# Patient Record
Sex: Female | Born: 1990 | Race: Black or African American | Hispanic: No | Marital: Single | Smoking: Former smoker
Health system: Southern US, Community
[De-identification: ages and names within clinical notes are randomized; demographics above are authoritative.]

## PROBLEM LIST (undated history)

## (undated) ENCOUNTER — Inpatient Hospital Stay (HOSPITAL_COMMUNITY): Payer: Self-pay

## (undated) DIAGNOSIS — A749 Chlamydial infection, unspecified: Secondary | ICD-10-CM

## (undated) DIAGNOSIS — A539 Syphilis, unspecified: Secondary | ICD-10-CM

## (undated) DIAGNOSIS — A523 Neurosyphilis, unspecified: Secondary | ICD-10-CM

## (undated) DIAGNOSIS — Z8619 Personal history of other infectious and parasitic diseases: Secondary | ICD-10-CM

---
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
## 2011-01-05 LAB — POCT URINALYSIS DIP (DEVICE)
Glucose, UA: NEGATIVE mg/dL
Hgb urine dipstick: NEGATIVE
Nitrite: NEGATIVE
Urobilinogen, UA: 0.2 mg/dL (ref 0.0–1.0)
pH: 7 (ref 5.0–8.0)

## 2011-02-10 NOTE — Consult Note (Signed)
[facility]  Patient:    Tracy Waller                        MRN: [id number] Proc. Date: 05/12/00 Attending:  Graylin Shiver. August Saucer, M.D.                          Consultation Report  CHIEF COMPLAINT:  Left wrist pain.  HISTORY OF PRESENT ILLNESS:  The patient is an 20-year-old right-hand-dominant female who fell out of her bunk bed tonight and injured her left wrist.  She had immediate onset of pain and deformity.  She denies any other orthopedic complaints specifically in the left elbow.  PAST MEDICAL AND SURGICAL HISTORY:  Unremarkable.  MEDICATIONS:  Apparently on no medications.  ALLERGIES:  She has no known drug allergies.  PHYSICAL EXAMINATION  EXTREMITIES:  Patient has a deformity in her left wrist.  Pedal pulses are palpable.  EPL, FPL and interosseous are intact.  Patient has diffuse numbness on the dorsal and plantar aspects of her hand.  Elbow range of motion is full. There is no other shoulder or lower extremity complaints.  X-RAY FINDINGS:  Plain x-rays show a displaced distal radius fracture with apex volar angulation and only about 10% contact in the metaphyseal region.  IMPRESSION:  Displaced left distal radius fracture.  DESCRIPTION OF PROCEDURE:  Using conscious sedation and hematoma block, the wrist is reduced.  Cast is placed and is bivalved.  Post-reduction x-rays show anatomic reduction.  PLAN:  Patient will follow up with me in one week for repeat x-rays out of the cast. DD:  05/13/00 TD:  05/14/00 Job: [id number]

## 2011-06-26 LAB — COMPREHENSIVE METABOLIC PANEL
ALT: 12
AST: 21
Albumin: 2.5 — ABNORMAL LOW
Alkaline Phosphatase: 94
BUN: 3 — ABNORMAL LOW
Potassium: 3.1 — ABNORMAL LOW
Sodium: 136
Total Protein: 6.7

## 2011-06-26 LAB — CBC
MCHC: 32.1
MCV: 78.7
Platelets: 267
Platelets: 278
RDW: 14.5

## 2011-06-26 LAB — RPR: RPR Ser Ql: NONREACTIVE

## 2011-06-26 LAB — URIC ACID: Uric Acid, Serum: 5.1

## 2011-06-27 LAB — CBC
HCT: 29.1 — ABNORMAL LOW
Hemoglobin: 9.3 — ABNORMAL LOW
MCHC: 32.7
MCHC: 33.3
MCV: 75.7 — ABNORMAL LOW
MCV: 80.9
Platelets: 263
RBC: 3.14 — ABNORMAL LOW
RDW: 14.6
WBC: 14 — ABNORMAL HIGH

## 2011-06-27 LAB — URINALYSIS, MICROSCOPIC ONLY
Ketones, ur: NEGATIVE
Nitrite: NEGATIVE
Protein, ur: NEGATIVE
Urobilinogen, UA: 0.2
pH: 7.5

## 2012-06-23 ENCOUNTER — Emergency Department (HOSPITAL_BASED_OUTPATIENT_CLINIC_OR_DEPARTMENT_OTHER)
Admission: EM | Admit: 2012-06-23 | Discharge: 2012-06-23 | Disposition: A | Discharge status: Home / Self Care | Payer: Self-pay | Attending: Emergency Medicine | Admitting: Emergency Medicine

## 2012-06-23 ENCOUNTER — Encounter (HOSPITAL_BASED_OUTPATIENT_CLINIC_OR_DEPARTMENT_OTHER): Payer: Self-pay | Admitting: Emergency Medicine

## 2012-06-23 DIAGNOSIS — J039 Acute tonsillitis, unspecified: Secondary | ICD-10-CM | POA: Insufficient documentation

## 2012-06-23 DIAGNOSIS — J45909 Unspecified asthma, uncomplicated: Secondary | ICD-10-CM | POA: Insufficient documentation

## 2012-06-23 MED ORDER — PENICILLIN V POTASSIUM 500 MG PO TABS: 1000.0000 mg | ORAL_TABLET | Freq: Two times a day (BID) | ORAL | Status: DC

## 2012-06-23 MED ORDER — PREDNISONE 20 MG PO TABS: ORAL_TABLET | ORAL | Status: DC

## 2012-06-23 NOTE — ED Notes (Signed)
Pt states she has had a fever, sore throat, difficulty swallowing. Exposed to Strep.

## 2012-06-23 NOTE — ED Notes (Signed)
Discharge instructions reviewed. Prescriptions reviewed. Pt verbalized understanding.

## 2012-06-23 NOTE — ED Provider Notes (Signed)
History     CSN: [id number]  Arrival date & time 06/23/12  9562   First MD Initiated Contact with Patient 06/23/12 1004      Chief Complaint  Patient presents with  . Sore Throat  . Fever    (Consider location/radiation/quality/duration/timing/severity/associated sxs/prior treatment) HPI This 21 year old female has 2 days of a mild to moderately severe sore throat with fever with tender lymph nodes in his neck with no drooling no stridor no voice change no cough no runny nose no chest pain no vomiting no abdominal pain no rash no confusion. He is a recent exposure to positive strep in his family and his sister has the same symptoms now as well. There is no treatment prior to arrival.  History reviewed. No pertinent past medical history.  History reviewed. No pertinent past surgical history.  Family History  Problem Relation Age of Onset  . Asthma Father     History  Substance Use Topics  . Smoking status: Never Smoker   . Smokeless tobacco: Never Used  . Alcohol Use: Yes     occasionally    OB History    Grav Para Term Preterm Abortions TAB SAB Ect Mult Living   2 2 2       2       Review of Systems 10 Systems reviewed and are negative for acute change except as noted in the HPI. Allergies  Review of patient's allergies indicates no known allergies.  Home Medications   Current Outpatient Rx  Name Route Sig Dispense Refill  . PENICILLIN V POTASSIUM 500 MG PO TABS Oral Take 2 tablets (1,000 mg total) by mouth 2 (two) times daily. X 7 days 28 tablet 0  . PREDNISONE 20 MG PO TABS  3 tabs po daily x 2 days 6 tablet 0    Pulse 85  Temp 98.7 F (37.1 C) (Oral)  Resp 18  LMP 04/22/2012  Physical Exam  Nursing note and vitals reviewed. Constitutional:       Awake, alert, nontoxic appearance.  HENT:  Head: Atraumatic.  Mouth/Throat: No oropharyngeal exudate.       Oral mucosa moist, uvula midline, bilateral tonsillitis with erythema and swelling without obvious  peritonsillar abscess and no exudates as well as no airway compromise no trismus no drooling  Eyes: Right eye exhibits no discharge. Left eye exhibits no discharge.  Neck: Neck supple.  Cardiovascular: Normal rate and regular rhythm.   No murmur heard. Pulmonary/Chest: Effort normal and breath sounds normal. No stridor. No respiratory distress. She has no wheezes. She has no rales. She exhibits no tenderness.  Abdominal: Soft. There is no tenderness. There is no rebound.  Musculoskeletal: She exhibits no tenderness.       Baseline ROM, no obvious new focal weakness.  Lymphadenopathy:    She has cervical adenopathy.  Neurological: She is alert.       Mental status and motor strength appears baseline for patient and situation.  Skin: No rash noted.  Psychiatric: She has a normal mood and affect.    ED Course  Procedures (including critical care time)  Labs Reviewed - No data to display No results found.   1. Tonsillitis       MDM  Patient / Family / Caregiver informed of clinical course, understand medical decision-making process, and agree with plan.I doubt any other EMC precluding discharge at this time including, but not necessarily limited to the following:PTA.        Hurman Horn, MD  06/23/12 1024 

## 2012-09-25 NOTE — L&D Delivery Note (Signed)
Delivery Note At 8:18 PM a viable female was delivered via Vaginal, Spontaneous Delivery (Presentation: ; Occiput Anterior).  APGAR: 9-10 , ; weight : 3493 gms..   Placenta status: Intact, Spontaneous.  Cord: 3 vessels with the following complications: None.  Cord pH: none  Anesthesia: Epidural  Episiotomy: none Lacerations: none Suture Repair: none Est. Blood Loss (mL): 350  Mom to postpartum.  Baby to nursery-stable.  HARPER,CHARLES A 06/06/2013, 8:42 PM

## 2012-10-22 ENCOUNTER — Inpatient Hospital Stay (HOSPITAL_COMMUNITY)
Admission: AD | Admit: 2012-10-22 | Discharge: 2012-10-22 | Disposition: A | Discharge status: Home / Self Care | Payer: Medicaid Other | Source: Ambulatory Visit | Attending: Obstetrics & Gynecology | Admitting: Obstetrics & Gynecology

## 2012-10-22 ENCOUNTER — Inpatient Hospital Stay (HOSPITAL_COMMUNITY): Payer: Medicaid Other

## 2012-10-22 ENCOUNTER — Encounter (HOSPITAL_COMMUNITY): Payer: Self-pay | Admitting: *Deleted

## 2012-10-22 DIAGNOSIS — A499 Bacterial infection, unspecified: Secondary | ICD-10-CM | POA: Insufficient documentation

## 2012-10-22 DIAGNOSIS — N760 Acute vaginitis: Secondary | ICD-10-CM | POA: Insufficient documentation

## 2012-10-22 DIAGNOSIS — O4690 Antepartum hemorrhage, unspecified, unspecified trimester: Secondary | ICD-10-CM

## 2012-10-22 DIAGNOSIS — O209 Hemorrhage in early pregnancy, unspecified: Secondary | ICD-10-CM | POA: Insufficient documentation

## 2012-10-22 DIAGNOSIS — R109 Unspecified abdominal pain: Secondary | ICD-10-CM | POA: Insufficient documentation

## 2012-10-22 DIAGNOSIS — O2390 Unspecified genitourinary tract infection in pregnancy, unspecified trimester: Secondary | ICD-10-CM | POA: Insufficient documentation

## 2012-10-22 DIAGNOSIS — B9689 Other specified bacterial agents as the cause of diseases classified elsewhere: Secondary | ICD-10-CM | POA: Insufficient documentation

## 2012-10-22 LAB — URINALYSIS, ROUTINE W REFLEX MICROSCOPIC
Bilirubin Urine: NEGATIVE
Glucose, UA: NEGATIVE mg/dL
Hgb urine dipstick: NEGATIVE
Specific Gravity, Urine: >1.03 — ABNORMAL HIGH (ref 1.005–1.030)
pH: 6 (ref 5.0–8.0)

## 2012-10-22 LAB — CBC WITH DIFFERENTIAL/PLATELET
Basophils Relative: 0 % (ref 0–1)
Eosinophils Absolute: 0.1 10*3/uL (ref 0.0–0.7)
Eosinophils Relative: 1 % (ref 0–5)
Hemoglobin: 10.7 g/dL — ABNORMAL LOW (ref 12.0–15.0)
MCH: 26 pg (ref 26.0–34.0)
MCHC: 32.7 g/dL (ref 30.0–36.0)
MCV: 79.6 fL (ref 78.0–100.0)
Monocytes Relative: 6 % (ref 3–12)
Neutrophils Relative %: 64 % (ref 43–77)
Platelets: 272 10*3/uL (ref 150–400)

## 2012-10-22 LAB — OB RESULTS CONSOLE GC/CHLAMYDIA
Chlamydia: NEGATIVE
Gonorrhea: NEGATIVE

## 2012-10-22 LAB — GC/CHLAMYDIA PROBE AMP
CT Probe RNA: NEGATIVE
GC Probe RNA: NEGATIVE

## 2012-10-22 LAB — WET PREP, GENITAL: Trich, Wet Prep: NONE SEEN

## 2012-10-22 MED ORDER — METRONIDAZOLE 500 MG PO TABS: 500.0000 mg | ORAL_TABLET | Freq: Two times a day (BID) | ORAL | Status: DC

## 2012-10-22 NOTE — MAU Provider Note (Signed)
History     CSN: [id number]  Arrival date and time: 10/22/12 0009   First Provider Initiated Contact with Patient 10/22/12 0053      Chief Complaint  Patient presents with  . Abdominal Cramping   HPI Tracy Waller is a 22 y.o. female at approximately [redacted] weeks gestation who presents to MAU with abdominal pain. The pain started approximately 9 pm while at work. She had to leave work due to the pain.  She describes the pain as sharp that comes and goes.  The onset was sudden. She rates the pain as 10/10 initially but now 6/10.  Associated symptoms include vaginal bleeding that she describes as less than a period. The history was provided by the patient.  OB History    Grav Para Term Preterm Abortions TAB SAB Ect Mult Living   2 2 2       2       Past Medical History  Diagnosis Date  . Hypertension   . No pertinent past medical history     Past Surgical History  Procedure Date  . Cesarean section   . No past surgeries     Family History  Problem Relation Age of Onset  . Asthma Father     History  Substance Use Topics  . Smoking status: Never Smoker   . Smokeless tobacco: Never Used  . Alcohol Use: No     Comment: occasionally    Allergies: No Known Allergies  Prescriptions prior to admission  Medication Sig Dispense Refill  . penicillin v potassium (VEETID) 500 MG tablet Take 2 tablets (1,000 mg total) by mouth 2 (two) times daily. X 7 days  28 tablet  0  . predniSONE (DELTASONE) 20 MG tablet 3 tabs po daily x 2 days  6 tablet  0    Review of Systems  Constitutional: Negative for fever, chills and weight loss.  HENT: Negative for ear pain, nosebleeds, congestion, sore throat and neck pain.   Eyes: Negative for blurred vision, double vision, photophobia and pain.  Respiratory: Negative for cough, shortness of breath and wheezing.   Cardiovascular: Negative for chest pain, palpitations and leg swelling.  Gastrointestinal: Positive for abdominal pain. Negative  for heartburn, nausea, vomiting, diarrhea and constipation.  Genitourinary: Negative for dysuria, urgency and frequency.       Vaginal bleeding  Musculoskeletal: Positive for back pain. Negative for myalgias.  Skin: Negative for itching and rash.  Neurological: Negative for dizziness, sensory change, speech change, seizures, weakness and headaches.  Endo/Heme/Allergies: Does not bruise/bleed easily.  Psychiatric/Behavioral: Negative for depression and substance abuse. The patient is not nervous/anxious and does not have insomnia.    Blood pressure 128/74, pulse 94, temperature 97.9 F (36.6 C), temperature source Oral, resp. rate 20, height 5\' 3"  (1.6 m), weight 171 lb 4 oz (77.678 kg), last menstrual period 08/19/2012, SpO2 100.00%.  Physical Exam  Nursing note and vitals reviewed. Constitutional: She is oriented to person, place, and time. She appears well-developed and well-nourished. No distress.  HENT:  Head: Normocephalic and atraumatic.  Eyes: EOM are normal.  Neck: Neck supple.  Cardiovascular: Normal rate.   Respiratory: Effort normal.  GI: Soft. There is tenderness. There is no rebound and no guarding.       Minimal tenderness lower abdomen with palpation.  Genitourinary:       External genitalia without lesions, no blood noted. Cervix inflamed, long, closed, no CMT, no adnexal tenderness. Unable to palpate uterus due to patient habitus.  Musculoskeletal: Normal range of motion.  Neurological: She is alert and oriented to person, place, and time.  Skin: Skin is warm and dry.  Psychiatric: She has a normal mood and affect. Her behavior is normal. Judgment and thought content normal.   Results for orders placed during the hospital encounter of 10/22/12 (from the past 24 hour(s))  URINALYSIS, ROUTINE W REFLEX MICROSCOPIC     Status: Abnormal   Collection Time   10/22/12 12:15 AM      Component Value Range   Color, Urine YELLOW  YELLOW   APPearance CLEAR  CLEAR   Specific  Gravity, Urine >1.030 (*) 1.005 - 1.030   pH 6.0  5.0 - 8.0   Glucose, UA NEGATIVE  NEGATIVE mg/dL   Hgb urine dipstick NEGATIVE  NEGATIVE   Bilirubin Urine NEGATIVE  NEGATIVE   Ketones, ur NEGATIVE  NEGATIVE mg/dL   Protein, ur NEGATIVE  NEGATIVE mg/dL   Urobilinogen, UA 1.0  0.0 - 1.0 mg/dL   Nitrite NEGATIVE  NEGATIVE   Leukocytes, UA NEGATIVE  NEGATIVE  POCT PREGNANCY, URINE     Status: Abnormal   Collection Time   10/22/12 12:32 AM      Component Value Range   Preg Test, Ur POSITIVE (*) NEGATIVE  WET PREP, GENITAL     Status: Abnormal   Collection Time   10/22/12  1:20 AM      Component Value Range   Yeast Wet Prep HPF POC NONE SEEN  NONE SEEN   Trich, Wet Prep NONE SEEN  NONE SEEN   Clue Cells Wet Prep HPF POC MODERATE (*) NONE SEEN   WBC, Wet Prep HPF POC FEW (*) NONE SEEN  CBC WITH DIFFERENTIAL     Status: Abnormal   Collection Time   10/22/12  1:20 AM      Component Value Range   WBC 11.8 (*) 4.0 - 10.5 K/uL   RBC 4.11  3.87 - 5.11 MIL/uL   Hemoglobin 10.7 (*) 12.0 - 15.0 g/dL   HCT 62.1 (*) 30.8 - 65.7 %   MCV 79.6  78.0 - 100.0 fL   MCH 26.0  26.0 - 34.0 pg   MCHC 32.7  30.0 - 36.0 g/dL   RDW 84.6  96.2 - 95.2 %   Platelets 272  150 - 400 K/uL   Neutrophils Relative 64  43 - 77 %   Neutro Abs 7.5  1.7 - 7.7 K/uL   Lymphocytes Relative 29  12 - 46 %   Lymphs Abs 3.4  0.7 - 4.0 K/uL   Monocytes Relative 6  3 - 12 %   Monocytes Absolute 0.7  0.1 - 1.0 K/uL   Eosinophils Relative 1  0 - 5 %   Eosinophils Absolute 0.1  0.0 - 0.7 K/uL   Basophils Relative 0  0 - 1 %   Basophils Absolute 0.0  0.0 - 0.1 K/uL    Procedures  US Ob Comp Less 14 Wks  10/22/2012  *RADIOLOGY REPORT*  Clinical Data: Pregnant, bleeding, cramping.  OBSTETRIC <14 WK ULTRASOUND  Technique:  Transabdominal ultrasound was performed for evaluation of the gestation as well as the maternal uterus and adnexal regions.  Comparison:  None.  Intrauterine gestational sac: Visualized/normal in shape. Yolk  sac: Identified Embryo: Identified Cardiac Activity: Identified Heart Rate: 170 bpm  CRL:  22 mm  8 w  6 d         [place] EDC: 05/28/2013  Maternal uterus/Adnexae: No subchorionic hemorrhage.  Normal sonographic appearance to the ovaries.  Corpus luteal cyst on the left.  No free fluid.  IMPRESSION: Single intrauterine gestation with cardiac activity documented. Estimated age of 8 weeks 6 days by crown-rump length.   Original Report Authenticated By: Jearld Lesch, M.D.     Assessment: 22 y.o. female @ 8.[redacted] weeks gestation with vaginal bleeding   Bacterial vaginosis   Left CLC  Plan:  Start prenatal care   Treat BV   Threatened AB precautions, return as needed  I have reviewed this patient's vital signs, nurses notes, appropriate labs and imaging.  I have discussed findings with the patient and she voices understanding.    Medication List     As of 10/26/2012  4:58 PM    START taking these medications         metroNIDAZOLE 500 MG tablet   Commonly known as: FLAGYL   Take 1 tablet (500 mg total) by mouth 2 (two) times daily.      STOP taking these medications         penicillin v potassium 500 MG tablet   Commonly known as: VEETID      predniSONE 20 MG tablet   Commonly known as: DELTASONE          Where to get your medications    These are the prescriptions that you need to pick up.   You may get these medications from any pharmacy.         metroNIDAZOLE 500 MG tablet            Alecxis Baltzell, RN, FNP, BC 10/22/2012, 2:16 AM

## 2012-10-22 NOTE — MAU Note (Signed)
Pt states 1.5 hours ago she starte bleeding and cramping-is not wearing a pad

## 2012-10-29 NOTE — MAU Provider Note (Signed)
Attestation of Attending Supervision of Advanced Practitioner (CNM/NP): Evaluation and management procedures were performed by the Advanced Practitioner under my supervision and collaboration. I have reviewed the Advanced Practitioner's note and chart, and I agree with the management and plan.  Izabelle Daus H. 9:14 PM   

## 2012-12-11 ENCOUNTER — Encounter (HOSPITAL_COMMUNITY): Payer: Self-pay

## 2012-12-11 ENCOUNTER — Inpatient Hospital Stay (HOSPITAL_COMMUNITY)
Admission: AD | Admit: 2012-12-11 | Discharge: 2012-12-11 | Disposition: A | Discharge status: Home / Self Care | Payer: Medicaid Other | Source: Ambulatory Visit | Attending: Family Medicine | Admitting: Family Medicine

## 2012-12-11 DIAGNOSIS — K089 Disorder of teeth and supporting structures, unspecified: Secondary | ICD-10-CM

## 2012-12-11 DIAGNOSIS — O0930 Supervision of pregnancy with insufficient antenatal care, unspecified trimester: Secondary | ICD-10-CM | POA: Insufficient documentation

## 2012-12-11 DIAGNOSIS — O99891 Other specified diseases and conditions complicating pregnancy: Secondary | ICD-10-CM | POA: Insufficient documentation

## 2012-12-11 DIAGNOSIS — Z3482 Encounter for supervision of other normal pregnancy, second trimester: Secondary | ICD-10-CM

## 2012-12-11 DIAGNOSIS — K0889 Other specified disorders of teeth and supporting structures: Secondary | ICD-10-CM

## 2012-12-11 MED ORDER — ACETAMINOPHEN 325 MG PO TABS
650.0000 mg | ORAL_TABLET | Freq: Once | ORAL | Status: AC
  Administered 2012-12-11: 650 mg via ORAL
  Filled 2012-12-11: qty 2

## 2012-12-11 NOTE — MAU Provider Note (Signed)
  History     CSN: [id number]  Arrival date and time: 12/11/12 0808   First Provider Initiated Contact with Patient 12/11/12 [phone]      Chief Complaint  Patient presents with  . Dental Pain   HPI Tracy Waller 22 y.o. [redacted]w[redacted]d   No prenatal care yet.  Comes to MAU with tooth pain.  Had swelling yesterday in right lower jaw.  Reports bleeding gums also.    OB History   Grav Para Term Preterm Abortions TAB SAB Ect Mult Living   3 2 2       2       Past Medical History  Diagnosis Date  . Hypertension   . No pertinent past medical history     Past Surgical History  Procedure Laterality Date  . Cesarean section    . No past surgeries      Family History  Problem Relation Age of Onset  . Asthma Father     History  Substance Use Topics  . Smoking status: Never Smoker   . Smokeless tobacco: Never Used  . Alcohol Use: No     Comment: occasionally    Allergies: No Known Allergies  Prescriptions prior to admission  Medication Sig Dispense Refill  . Aspirin-Salicylamide-Caffeine (BC HEADACHE POWDER PO) Take 1 packet by mouth every 6 (six) hours as needed. For headache        Review of Systems  Constitutional: Negative for fever.  HENT:       Tooth pain  Gastrointestinal: Negative for nausea, vomiting and abdominal pain.  Genitourinary:       No vaginal discharge. No vaginal bleeding. No dysuria.   Physical Exam   Blood pressure 125/87, pulse 101, temperature 98.4 F (36.9 C), temperature source Oral, resp. rate 18, height 5\' 2"  (1.575 m), weight 170 lb 6.4 oz (77.293 kg), last menstrual period 08/19/2012.  Physical Exam  Nursing note and vitals reviewed. Constitutional: She is oriented to person, place, and time. She appears well-developed and well-nourished.  HENT:  Head: Normocephalic.  No swelling of face or gums.  Tender area on right lower jaw to palpation.  Teeth intact.  Fillings noted in some teeth and are intact.  Eyes: EOM are normal.  Neck: Neck  supple.  GI:  FHT heard with doppler  Musculoskeletal: Normal range of motion.  Neurological: She is alert and oriented to person, place, and time.  Skin: Skin is warm and dry.  Psychiatric: She has a normal mood and affect.    MAU Course  Procedures  MDM   Assessment and Plan  Tooth pain in pregnancy  Plan Message sent to clinic downstairs to schedule an appointment to begin prenatal care. Number given for Tracy Waller to call and register for Medicaid downstairs. Sheet of dentists and [facility] dental clinic given.  Tracy Waller instructed she will need to be prepared to pay out of pocket for dentist. Drink at least 8 8-oz glasses of water every day. Take Tylenol 325 mg 2 tablets by mouth every 4 hours if needed for pain.   Wisdom Rickey 12/11/2012, 9:27 AM

## 2012-12-11 NOTE — MAU Note (Signed)
Toothache x 2 days 

## 2012-12-11 NOTE — MAU Note (Signed)
Patient states she has had a tooth ache for two days. Has had no prenatal care. Is having no problems with the pregnancy, no pain or bleeding. Would like to listen to fetal heart tones.

## 2012-12-11 NOTE — MAU Provider Note (Signed)
Chart reviewed and agree with management and plan.  

## 2012-12-13 ENCOUNTER — Inpatient Hospital Stay (HOSPITAL_COMMUNITY)
Admission: AD | Admit: 2012-12-13 | Discharge: 2012-12-13 | Disposition: A | Discharge status: Home / Self Care | Payer: Self-pay | Source: Ambulatory Visit | Attending: Obstetrics & Gynecology | Admitting: Obstetrics & Gynecology

## 2012-12-13 NOTE — MAU Note (Signed)
Patient had an appointment with Nita the financial representative today at 1445. When she arrived at the front desk there was no one there to direct her to this office. Patient was sent to MAU and she registered for her appointment. Patient is not to be seen in MAU.

## 2012-12-19 ENCOUNTER — Inpatient Hospital Stay (HOSPITAL_COMMUNITY)
Admission: AD | Admit: 2012-12-19 | Discharge: 2012-12-19 | Disposition: A | Discharge status: Home / Self Care | Payer: Medicaid Other | Source: Ambulatory Visit | Attending: Obstetrics and Gynecology | Admitting: Obstetrics and Gynecology

## 2012-12-19 DIAGNOSIS — O99891 Other specified diseases and conditions complicating pregnancy: Secondary | ICD-10-CM | POA: Insufficient documentation

## 2012-12-19 DIAGNOSIS — K089 Disorder of teeth and supporting structures, unspecified: Secondary | ICD-10-CM | POA: Insufficient documentation

## 2012-12-19 DIAGNOSIS — K0889 Other specified disorders of teeth and supporting structures: Secondary | ICD-10-CM

## 2012-12-19 DIAGNOSIS — M549 Dorsalgia, unspecified: Secondary | ICD-10-CM

## 2012-12-19 DIAGNOSIS — O209 Hemorrhage in early pregnancy, unspecified: Secondary | ICD-10-CM | POA: Insufficient documentation

## 2012-12-19 LAB — URINALYSIS, ROUTINE W REFLEX MICROSCOPIC
Bilirubin Urine: NEGATIVE
Glucose, UA: NEGATIVE mg/dL
Hgb urine dipstick: NEGATIVE
Protein, ur: NEGATIVE mg/dL
Urobilinogen, UA: 0.2 mg/dL (ref 0.0–1.0)

## 2012-12-19 MED ORDER — AMOXICILLIN 500 MG PO CAPS
1000.0000 mg | ORAL_CAPSULE | Freq: Once | ORAL | Status: AC
  Administered 2012-12-19: 1000 mg via ORAL
  Filled 2012-12-19: qty 2

## 2012-12-19 MED ORDER — AMOXICILLIN 500 MG PO CAPS: 500.0000 mg | ORAL_CAPSULE | Freq: Three times a day (TID) | ORAL | Status: AC

## 2012-12-19 NOTE — MAU Note (Addendum)
Patient is in with in c/o right lateral side pain and vaginal bleeding this morning. Patient is not bleeding now, she is without a pad. She states that her first OB appt with [facility] is April 26th. Patient states that she have a slight burning with urination.

## 2012-12-19 NOTE — MAU Provider Note (Signed)
History     CSN: [id number]  Arrival date and time: 12/19/12 1318   None     Chief Complaint  Patient presents with  . Vaginal Bleeding   Vaginal Bleeding  G3P2 [redacted]w[redacted]d presents for abdominal pain, vaginal bleeding X 1 day, and tooth ache.  Abdominal pain Began last night while lying on couch. Onset was sudden. Located over R flank. Worse when getting up and walking. Better when lying still. No inciting even. No radiation. No nausea/vomiting. No fever/chills. No recent illnesses. No heavy lifting but pt has a 21 y/o and 21 y/o at home with her. Some painful urination, no frequency.   Vaginal bleeding Reports seeing a blood clot in her underwear this morning. While she was in the shower she saw another clot drip into the shower. Reports some abdominal pain, denies other symptoms (see above).  Tooth pain She reports tooth pain on and off for the past month. She says that she gets tooth and gum pain during pregnancy but this is different. Right now it hurt on her R lower jaw. She has not been to the dentist in a long time. She was evaluated here previously this month for the same problem and was told to take Tylenol, which isn't helping.   Past Medical History  Diagnosis Date  . Hypertension   . No pertinent past medical history     Past Surgical History  Procedure Laterality Date  . Cesarean section    . No past surgeries      Family History  Problem Relation Age of Onset  . Asthma Father     History  Substance Use Topics  . Smoking status: Never Smoker   . Smokeless tobacco: Never Used  . Alcohol Use: No     Comment: occasionally    Allergies: No Known Allergies  No prescriptions prior to admission    Review of Systems  Genitourinary: Positive for vaginal bleeding.   Pertinent positive and negatives discussed in HPI.  Physical Exam   Blood pressure 127/67, pulse 94, temperature 98.7 F (37.1 C), temperature source Oral, resp. rate 16, height 5\' 3"  (1.6 m),  weight 77.225 kg (170 lb 4 oz), last menstrual period 08/19/2012, SpO2 100.00%.  Physical Exam  Constitutional: She appears well-developed and well-nourished. No distress.  HENT:  Mouth/Throat:    Dentition in moderate condition. Multiple fillings throughout mouth. Pt reports tenderness to tooth #30 with tapping with a tongue depressor.  Respiratory: No respiratory distress.  GI: Soft. She exhibits no distension. There is tenderness (located over R flank/back). There is no rebound and no guarding.  Pelvic:  External genitalia wnl            Vagina- moderate amount white discharge without odor.  Neg for bleeding or blood seen           Cervix was closed and nontender           Uterus-17 week size nontender Skin warm and dry Psy-normal mood, affect, thought process   Results for orders placed during the hospital encounter of 12/19/12 (from the past 24 hour(s))  URINALYSIS, ROUTINE W REFLEX MICROSCOPIC     Status: None   Collection Time    12/19/12  1:35 PM      Result Value Range   Color, Urine YELLOW  YELLOW   APPearance CLEAR  CLEAR   Specific Gravity, Urine 1.020  1.005 - 1.030   pH 6.5  5.0 - 8.0   Glucose, UA NEGATIVE  NEGATIVE mg/dL   Hgb urine dipstick NEGATIVE  NEGATIVE   Bilirubin Urine NEGATIVE  NEGATIVE   Ketones, ur NEGATIVE  NEGATIVE mg/dL   Protein, ur NEGATIVE  NEGATIVE mg/dL   Urobilinogen, UA 0.2  0.0 - 1.0 mg/dL   Nitrite NEGATIVE  NEGATIVE   Leukocytes, UA NEGATIVE  NEGATIVE    MAU Course  Procedures  MDM Due to tenderness and association with movement I believe that the pain is musculoskeletal in nature.  Fetal heart tones present at 150 bpm, no need for ultrasound today. Cervix closed and no blood from cervix.  No obvious abscesses to mouth. Treating for presumptive tooth infection as pt is pregnant and does not currently have access to a dentist.   Assessment and Plan  Back pain, musculoskeletal  - Rest, ice, heat, tylenol - try to practice better  body mechanics when lifting  Vaginal bleeding - No obvious lesions present on pelvic exam, no bleeding, cervix closed - Fetal heart tones present - Ok to DC home  Tooth pain - Rx. Amoxicillin 1 g po x 1, 500 g PO TID x 3 days--1gm amoxicillin given in MAU - See dentist ASAP when medicaid comes in  [place], Ephriam Knuckles 12/19/2012, 2:48 PM   I have reviewed the HPI with the student, observed his physical exam, reviewed lab and plan of care and agree with his findings

## 2012-12-20 NOTE — MAU Provider Note (Signed)
Attestation of Attending Supervision of Advanced Practitioner (CNM/NP): Evaluation and management procedures were performed by the Advanced Practitioner under my supervision and collaboration.  I have reviewed the Advanced Practitioner's note and chart, and I agree with the management and plan.  Zoriah Pulice 12/20/2012 8:10 AM   

## 2013-01-03 ENCOUNTER — Encounter (HOSPITAL_COMMUNITY): Payer: Self-pay

## 2013-01-03 ENCOUNTER — Inpatient Hospital Stay (HOSPITAL_COMMUNITY)
Admission: AD | Admit: 2013-01-03 | Discharge: 2013-01-03 | Disposition: A | Discharge status: Home / Self Care | Payer: Medicaid Other | Source: Ambulatory Visit | Attending: Obstetrics & Gynecology | Admitting: Obstetrics & Gynecology

## 2013-01-03 DIAGNOSIS — K047 Periapical abscess without sinus: Secondary | ICD-10-CM

## 2013-01-03 DIAGNOSIS — O99891 Other specified diseases and conditions complicating pregnancy: Secondary | ICD-10-CM | POA: Insufficient documentation

## 2013-01-03 DIAGNOSIS — K089 Disorder of teeth and supporting structures, unspecified: Secondary | ICD-10-CM | POA: Insufficient documentation

## 2013-01-03 DIAGNOSIS — K044 Acute apical periodontitis of pulpal origin: Secondary | ICD-10-CM

## 2013-01-03 MED ORDER — IBUPROFEN 600 MG PO TABS: 600.0000 mg | ORAL_TABLET | Freq: Four times a day (QID) | ORAL | Status: DC | PRN

## 2013-01-03 NOTE — MAU Provider Note (Signed)
  History     CSN: [id number]  Arrival date and time: 01/03/13 7829   First Provider Initiated Contact with Patient 01/03/13 [phone]      Chief Complaint  Patient presents with  . Dental Pain   HPI  Tracy Waller is a 22 y.o. who presents with 2 weeks of R jaw swelling. She is also having associated headaches. She was here for a similar issue 2-3 weeks ago, was prescribed amoxicillin which did not help. She states it is different now because there was just pain before but now it is swollen. Denies any bleeding. Took BC powder for pain has rinsed mouth with salt water. Has not seen a dentist due medicaid coverage not being activated yet.   OB History   Grav Para Term Preterm Abortions TAB SAB Ect Mult Living   3 2 2       2       Past Medical History  Diagnosis Date  . Hypertension   . No pertinent past medical history     Past Surgical History  Procedure Laterality Date  . Cesarean section      Family History  Problem Relation Age of Onset  . Asthma Father     History  Substance Use Topics  . Smoking status: Never Smoker   . Smokeless tobacco: Never Used  . Alcohol Use: No     Comment: occasionally    Allergies: No Known Allergies  Prescriptions prior to admission  Medication Sig Dispense Refill  . Aspirin-Caffeine (BC FAST PAIN RELIEF PO) Take by mouth.        Review of Systems  Constitutional: Negative for fever and chills.  Eyes: Negative.   Respiratory: Negative.   Cardiovascular: Negative.   Gastrointestinal: Negative.   Genitourinary: Negative.   Musculoskeletal: Negative.   Neurological: Positive for headaches.  Endo/Heme/Allergies: Negative.    Physical Exam   Blood pressure 114/66, pulse 86, temperature 98.5 F (36.9 C), temperature source Oral, resp. rate 16, height 5\' 3"  (1.6 m), weight 77.565 kg (171 lb), last menstrual period 08/19/2012, SpO2 100.00%.  FHT: 147 bpm   Physical Exam  HENT:  Head:    Small, mobile 2cm mass along R  mandible which is tender to palpation.No erythema,bleeding along gums, or drainage. Dentition intact.    Non tender Mandibular lymphadenopathy noted  MAU Course  Procedures  MDM Fetal heart tones: 147  Assessment and Plan  1. Dental pain, referred to go see a dentist. Last time she was seen did not try to go to a dentist.  2. Aspirin use educated to avoid aspirin use when possible. Possible option given ibuprofen (instructed not to use in 3rd trimester).  Rosalee Kaufman 01/03/2013, 6:09 AM   I was present for the exam and agree with above. No emergency condition evident. Pt afebrile and well-appearing. Pt STRONGLY encourage to go to dentist for this problem and to inquire about sliding scale/payment programs. MAU is not appropriate place to seek dental care, especially w/ failure of Amox. If fever occurs and or Sx worsen prior to dental appt, go to [facility] ED. Dr. Debroah Loop aware, agrees w/ POC.  Dental letter given.  [place], CNM 01/03/2013 6:59 AM

## 2013-01-03 NOTE — MAU Note (Signed)
Lump on gum on right lower side of mouth, right jaw swollen, tooth pain. Swollen x2 weeks. Tooth pain x 3 weeks. Denies abdominal pain/vaginal bleeding/vaginal discharge.

## 2013-01-04 ENCOUNTER — Emergency Department (HOSPITAL_BASED_OUTPATIENT_CLINIC_OR_DEPARTMENT_OTHER)
Admission: EM | Admit: 2013-01-04 | Discharge: 2013-01-04 | Disposition: A | Discharge status: Home / Self Care | Payer: Medicaid Other | Attending: Emergency Medicine | Admitting: Emergency Medicine

## 2013-01-04 ENCOUNTER — Encounter (HOSPITAL_BASED_OUTPATIENT_CLINIC_OR_DEPARTMENT_OTHER): Payer: Self-pay | Admitting: *Deleted

## 2013-01-04 DIAGNOSIS — R0602 Shortness of breath: Secondary | ICD-10-CM | POA: Insufficient documentation

## 2013-01-04 DIAGNOSIS — Z8679 Personal history of other diseases of the circulatory system: Secondary | ICD-10-CM | POA: Insufficient documentation

## 2013-01-04 DIAGNOSIS — O9989 Other specified diseases and conditions complicating pregnancy, childbirth and the puerperium: Secondary | ICD-10-CM | POA: Insufficient documentation

## 2013-01-04 DIAGNOSIS — K047 Periapical abscess without sinus: Secondary | ICD-10-CM | POA: Insufficient documentation

## 2013-01-04 DIAGNOSIS — R059 Cough, unspecified: Secondary | ICD-10-CM | POA: Insufficient documentation

## 2013-01-04 DIAGNOSIS — R221 Localized swelling, mass and lump, neck: Secondary | ICD-10-CM | POA: Insufficient documentation

## 2013-01-04 DIAGNOSIS — R220 Localized swelling, mass and lump, head: Secondary | ICD-10-CM | POA: Insufficient documentation

## 2013-01-04 DIAGNOSIS — R05 Cough: Secondary | ICD-10-CM | POA: Insufficient documentation

## 2013-01-04 MED ORDER — HYDROCODONE-ACETAMINOPHEN 5-325 MG PO TABS: 2.0000 | ORAL_TABLET | ORAL | Status: DC | PRN

## 2013-01-04 MED ORDER — CLINDAMYCIN HCL 150 MG PO CAPS
300.0000 mg | ORAL_CAPSULE | Freq: Once | ORAL | Status: AC
  Administered 2013-01-04: 300 mg via ORAL
  Filled 2013-01-04: qty 2

## 2013-01-04 MED ORDER — CLINDAMYCIN HCL 300 MG PO CAPS: 300.0000 mg | ORAL_CAPSULE | Freq: Four times a day (QID) | ORAL | Status: DC

## 2013-01-04 MED ORDER — HYDROCODONE-ACETAMINOPHEN 5-325 MG PO TABS
2.0000 | ORAL_TABLET | Freq: Once | ORAL | Status: AC
  Administered 2013-01-04: 2 via ORAL
  Filled 2013-01-04: qty 2

## 2013-01-04 NOTE — ED Notes (Signed)
Pt states the lower right side of her face has been swollen x 2 weeks. Pt is [redacted] weeks pregnant and has been to [facility] for this particular issue as well several times over the past 2 weeks. Given Tylenol and antibiotics, but no better.

## 2013-01-04 NOTE — ED Provider Notes (Signed)
History     CSN: [id number]  Arrival date & time 01/04/13  3086   First MD Initiated Contact with Patient 01/04/13 1937      Chief Complaint  Patient presents with  . Facial Swelling    (Consider location/radiation/quality/duration/timing/severity/associated sxs/prior treatment) Patient is a 22 y.o. female presenting with tooth pain. The history is provided by the patient. No language interpreter was used.  Dental PainThe primary symptoms include mouth pain, shortness of breath and cough. Episode onset: 2 weeks. The symptoms are worsening. The symptoms are new. The symptoms occur constantly.  Additional symptoms include: gum swelling and gum tenderness.  Pt complains of swelling to right side of mouth  Past Medical History  Diagnosis Date  . Hypertension   . No pertinent past medical history     Past Surgical History  Procedure Laterality Date  . Cesarean section      Family History  Problem Relation Age of Onset  . Asthma Father     History  Substance Use Topics  . Smoking status: Never Smoker   . Smokeless tobacco: Never Used  . Alcohol Use: No     Comment: occasionally    OB History   Grav Para Term Preterm Abortions TAB SAB Ect Mult Living   3 2 2       2       Review of Systems  Respiratory: Positive for cough and shortness of breath.   All other systems reviewed and are negative.    Allergies  Review of patient's allergies indicates no known allergies.  Home Medications   Current Outpatient Rx  Name  Route  Sig  Dispense  Refill  . clindamycin (CLEOCIN) 300 MG capsule   Oral   Take 1 capsule (300 mg total) by mouth every 6 (six) hours.   28 capsule   0   . HYDROcodone-acetaminophen (NORCO/VICODIN) 5-325 MG per tablet   Oral   Take 2 tablets by mouth every 4 (four) hours as needed for pain.   10 tablet   0   . ibuprofen (ADVIL,MOTRIN) 600 MG tablet   Oral   Take 1 tablet (600 mg total) by mouth every 6 (six) hours as needed for pain.  30 tablet   0     BP 129/78  Pulse 88  Temp(Src) 99 F (37.2 C) (Oral)  Resp 18  Ht 5\' 3"  (1.6 m)  Wt 170 lb (77.111 kg)  BMI 30.12 kg/m2  SpO2 100%  LMP 08/19/2012  Physical Exam  Nursing note and vitals reviewed. Constitutional: She is oriented to person, place, and time. She appears well-developed and well-nourished.  HENT:  Head: Normocephalic.  Swollen right lower gumline,    Musculoskeletal: Normal range of motion.  Neurological: She is alert and oriented to person, place, and time. She has normal reflexes.  Psychiatric: She has a normal mood and affect.    ED Course  INCISION AND DRAINAGE Date/Time: 01/04/2013 10:47 PM Performed by: Elson Areas Authorized by: Elson Areas Consent: Verbal consent obtained. Patient identity confirmed: verbally with patient Type: abscess Body area: mouth Anesthesia: local infiltration Local anesthetic: lidocaine 2% without epinephrine Scalpel size: 11 Incision type: single straight Complexity: simple Drainage: purulent Drainage amount: scant Wound treatment: wound left open Comments: Moderate amount of drainage   (including critical care time)  Labs Reviewed - No data to display No results found.   1. Abscessed tooth       MDM    Pt given rx for  clindamycin and hydrocodone      Lonia Skinner Frederica, PA-C 01/04/13 2250

## 2013-01-04 NOTE — ED Provider Notes (Signed)
Medical screening examination/treatment/procedure(s) were performed by non-physician practitioner and as supervising physician I was immediately available for consultation/collaboration.   Drevon Plog, MD 01/04/13 2305 

## 2013-01-13 ENCOUNTER — Encounter: Payer: MEDICAID | Admitting: Obstetrics

## 2013-01-13 ENCOUNTER — Encounter: Payer: Self-pay | Admitting: Obstetrics

## 2013-02-20 ENCOUNTER — Encounter: Payer: Self-pay | Admitting: Obstetrics & Gynecology

## 2013-02-20 ENCOUNTER — Ambulatory Visit (INDEPENDENT_AMBULATORY_CARE_PROVIDER_SITE_OTHER): Payer: MEDICAID | Admitting: Obstetrics & Gynecology

## 2013-02-20 VITALS — BP 113/74 | Wt 177.0 lb

## 2013-02-20 DIAGNOSIS — Z3483 Encounter for supervision of other normal pregnancy, third trimester: Secondary | ICD-10-CM

## 2013-02-20 DIAGNOSIS — Z3482 Encounter for supervision of other normal pregnancy, second trimester: Secondary | ICD-10-CM

## 2013-02-20 DIAGNOSIS — Z369 Encounter for antenatal screening, unspecified: Secondary | ICD-10-CM

## 2013-02-20 DIAGNOSIS — K219 Gastro-esophageal reflux disease without esophagitis: Secondary | ICD-10-CM

## 2013-02-20 DIAGNOSIS — Z3480 Encounter for supervision of other normal pregnancy, unspecified trimester: Secondary | ICD-10-CM

## 2013-02-20 MED ORDER — OMEPRAZOLE 20 MG PO CPDR: 20.0000 mg | DELAYED_RELEASE_CAPSULE | Freq: Every day | ORAL | Status: DC

## 2013-02-20 NOTE — Progress Notes (Signed)
Pulse- 92 No urine specimen left by patient  Subjective:    Tracy Waller is being seen today for her first obstetrical visit.  This is not a planned pregnancy. She is at [redacted]w[redacted]d gestation. Her obstetrical history is significant for pregnancy induced hypertension. Relationship with FOB: not together. Patient does intend to breast feed. Pregnancy history fully reviewed.  Menstrual History: OB History   Grav Para Term Preterm Abortions TAB SAB Ect Mult Living   3 2 2       2       Menarche age: 75, Regular Patient's last menstrual period was 08/19/2012.,     The following portions of the patient's history were reviewed and updated as appropriate: allergies, current medications, past family history, past medical history, past social history, past surgical history and problem list.  Review of Systems Pertinent items are noted in HPI.    Objective:    General appearance: alert and no distress Abdomen: normal findings: soft, non-tender Pelvic: cervix normal in appearance, external genitalia normal, no adnexal masses or tenderness, no cervical motion tenderness, uterus normal size, shape, and consistency and vagina normal without discharge.  Uterus ~25weeks size, soft, NT.   Assessment:    Pregnancy at [redacted]w[redacted]d weeks    Plan:    Initial labs drawn. Prenatal vitamins. Problem list reviewed and updated. AFP3 discussed: not needed. Role of ultrasound in pregnancy discussed; fetal survey: done. Amniocentesis discussed: not indicated. Follow up in 2 weeks. 50% of 20 min visit spent on counseling and coordination of care.

## 2013-02-21 LAB — HIV ANTIBODY (ROUTINE TESTING W REFLEX): HIV: NONREACTIVE

## 2013-02-21 LAB — OBSTETRIC PANEL
Antibody Screen: NEGATIVE
Basophils Absolute: 0 10*3/uL (ref 0.0–0.1)
Eosinophils Absolute: 0.1 10*3/uL (ref 0.0–0.7)
Eosinophils Relative: 1 % (ref 0–5)
HCT: 31.8 % — ABNORMAL LOW (ref 36.0–46.0)
Lymphs Abs: 2.7 10*3/uL (ref 0.7–4.0)
MCH: 26.3 pg (ref 26.0–34.0)
MCV: 81.1 fL (ref 78.0–100.0)
Monocytes Absolute: 0.8 10*3/uL (ref 0.1–1.0)
Platelets: 251 10*3/uL (ref 150–400)
RDW: 14.5 % (ref 11.5–15.5)
Rubella: 1.67 Index — ABNORMAL HIGH (ref <0.90)

## 2013-02-21 LAB — VITAMIN D 25 HYDROXY (VIT D DEFICIENCY, FRACTURES): Vit D, 25-Hydroxy: 20 ng/mL — ABNORMAL LOW (ref 30–89)

## 2013-02-24 LAB — HEMOGLOBINOPATHY EVALUATION
Hgb A: 97.1 % (ref 96.8–97.8)
Hgb S Quant: 0 %

## 2013-02-25 ENCOUNTER — Other Ambulatory Visit: Payer: MEDICAID | Admitting: *Deleted

## 2013-02-25 ENCOUNTER — Ambulatory Visit (INDEPENDENT_AMBULATORY_CARE_PROVIDER_SITE_OTHER): Payer: Medicaid Other

## 2013-02-25 ENCOUNTER — Ambulatory Visit (INDEPENDENT_AMBULATORY_CARE_PROVIDER_SITE_OTHER): Payer: Medicaid Other | Admitting: Obstetrics

## 2013-02-25 VITALS — BP 131/74 | Temp 98.5°F | Wt 174.2 lb

## 2013-02-25 DIAGNOSIS — Z3480 Encounter for supervision of other normal pregnancy, unspecified trimester: Secondary | ICD-10-CM

## 2013-02-25 DIAGNOSIS — Z113 Encounter for screening for infections with a predominantly sexual mode of transmission: Secondary | ICD-10-CM

## 2013-02-25 DIAGNOSIS — Z3482 Encounter for supervision of other normal pregnancy, second trimester: Secondary | ICD-10-CM

## 2013-02-25 DIAGNOSIS — Z3483 Encounter for supervision of other normal pregnancy, third trimester: Secondary | ICD-10-CM

## 2013-02-25 DIAGNOSIS — Z369 Encounter for antenatal screening, unspecified: Secondary | ICD-10-CM

## 2013-02-25 DIAGNOSIS — B373 Candidiasis of vulva and vagina: Secondary | ICD-10-CM | POA: Insufficient documentation

## 2013-02-25 LAB — POCT URINALYSIS DIPSTICK
Bilirubin, UA: NEGATIVE
Glucose, UA: NEGATIVE
Ketones, UA: NEGATIVE
Nitrite, UA: NEGATIVE
pH, UA: 7

## 2013-02-25 LAB — US OB DETAIL + 14 WK

## 2013-02-25 MED ORDER — FLUCONAZOLE 150 MG PO TABS: 150.0000 mg | ORAL_TABLET | Freq: Once | ORAL | Status: DC

## 2013-02-25 NOTE — Addendum Note (Signed)
Addended by: Julaine Hua on: 02/25/2013 11:50 AM   Modules accepted: Orders

## 2013-02-25 NOTE — Progress Notes (Signed)
Pulse-95 No complaints. Pt is doing 2 hr GTT.

## 2013-02-26 ENCOUNTER — Other Ambulatory Visit: Payer: MEDICAID

## 2013-02-26 ENCOUNTER — Encounter: Payer: Self-pay | Admitting: Obstetrics

## 2013-02-26 LAB — CBC
HCT: 32.4 % — ABNORMAL LOW (ref 36.0–46.0)
MCH: 26.1 pg (ref 26.0–34.0)
MCHC: 31.8 g/dL (ref 30.0–36.0)
MCV: 82.2 fL (ref 78.0–100.0)
Platelets: 238 10*3/uL (ref 150–400)
RDW: 14.7 % (ref 11.5–15.5)
WBC: 11.4 10*3/uL — ABNORMAL HIGH (ref 4.0–10.5)

## 2013-02-26 LAB — GLUCOSE TOLERANCE, 2 HOURS W/ 1HR: Glucose, Fasting: 55 mg/dL — ABNORMAL LOW (ref 70–99)

## 2013-02-28 LAB — CULTURE, OB URINE: Colony Count: 30000

## 2013-03-04 ENCOUNTER — Telehealth: Payer: Self-pay | Admitting: *Deleted

## 2013-03-04 ENCOUNTER — Encounter: Payer: Self-pay | Admitting: *Deleted

## 2013-03-04 MED ORDER — CEPHALEXIN 500 MG PO CAPS: 500.0000 mg | ORAL_CAPSULE | Freq: Two times a day (BID) | ORAL | Status: DC

## 2013-03-04 NOTE — Telephone Encounter (Signed)
Attempted to contact patient phone number not valid, next office visit 03/13/2013 @ 9:45 am with Dr Clearance Coots. Per Dr Clearance Coots, Rx changed to Keflex  500 mg PO BID x 7 days

## 2013-03-04 NOTE — Telephone Encounter (Signed)
Attempted to contact patient at [phone] x [phone], recording reached stating the number is not valid, or has been changed. Letter mailed to patient informing of patient of Vitamin D.

## 2013-03-13 ENCOUNTER — Encounter: Payer: MEDICAID | Admitting: Obstetrics

## 2013-03-18 NOTE — Progress Notes (Signed)
Doing well 

## 2013-03-21 NOTE — Progress Notes (Signed)
Doing well 

## 2013-04-03 ENCOUNTER — Encounter: Payer: Self-pay | Admitting: Obstetrics

## 2013-04-03 ENCOUNTER — Encounter: Payer: Self-pay | Admitting: *Deleted

## 2013-04-03 ENCOUNTER — Ambulatory Visit (INDEPENDENT_AMBULATORY_CARE_PROVIDER_SITE_OTHER): Payer: Medicaid Other | Admitting: Obstetrics

## 2013-04-03 VITALS — BP 129/82 | Temp 97.7°F | Wt 178.2 lb

## 2013-04-03 DIAGNOSIS — Z3483 Encounter for supervision of other normal pregnancy, third trimester: Secondary | ICD-10-CM

## 2013-04-03 DIAGNOSIS — Z3480 Encounter for supervision of other normal pregnancy, unspecified trimester: Secondary | ICD-10-CM

## 2013-04-03 DIAGNOSIS — J302 Other seasonal allergic rhinitis: Secondary | ICD-10-CM

## 2013-04-03 DIAGNOSIS — J309 Allergic rhinitis, unspecified: Secondary | ICD-10-CM

## 2013-04-03 LAB — POCT URINALYSIS DIPSTICK
Glucose, UA: NEGATIVE
Nitrite, UA: NEGATIVE
Protein, UA: NEGATIVE
Spec Grav, UA: 1.015
Urobilinogen, UA: NEGATIVE

## 2013-04-03 MED ORDER — LORATADINE 10 MG PO TABS: 10.0000 mg | ORAL_TABLET | Freq: Every day | ORAL | Status: DC

## 2013-04-03 NOTE — Progress Notes (Signed)
Pulse: 99 

## 2013-04-17 ENCOUNTER — Ambulatory Visit (INDEPENDENT_AMBULATORY_CARE_PROVIDER_SITE_OTHER): Payer: Medicaid Other | Admitting: Obstetrics

## 2013-04-17 VITALS — BP 122/78 | Temp 98.3°F | Wt 178.2 lb

## 2013-04-17 DIAGNOSIS — Z3480 Encounter for supervision of other normal pregnancy, unspecified trimester: Secondary | ICD-10-CM

## 2013-04-17 DIAGNOSIS — Z3483 Encounter for supervision of other normal pregnancy, third trimester: Secondary | ICD-10-CM

## 2013-04-17 LAB — POCT URINALYSIS DIPSTICK
Ketones, UA: NEGATIVE
Leukocytes, UA: NEGATIVE
Protein, UA: NEGATIVE
Spec Grav, UA: 1.015
Urobilinogen, UA: NEGATIVE
pH, UA: 7

## 2013-04-17 NOTE — Progress Notes (Signed)
Pulse: 85

## 2013-04-24 ENCOUNTER — Encounter: Payer: Medicaid Other | Admitting: Obstetrics

## 2013-05-07 ENCOUNTER — Encounter: Payer: Self-pay | Admitting: Obstetrics

## 2013-05-07 ENCOUNTER — Ambulatory Visit (INDEPENDENT_AMBULATORY_CARE_PROVIDER_SITE_OTHER): Payer: Medicaid Other | Admitting: Obstetrics

## 2013-05-07 VITALS — BP 138/80 | Temp 98.3°F | Wt 181.6 lb

## 2013-05-07 DIAGNOSIS — Z3483 Encounter for supervision of other normal pregnancy, third trimester: Secondary | ICD-10-CM

## 2013-05-07 DIAGNOSIS — Z3480 Encounter for supervision of other normal pregnancy, unspecified trimester: Secondary | ICD-10-CM

## 2013-05-07 LAB — POCT URINALYSIS DIPSTICK
Bilirubin, UA: NEGATIVE
Blood, UA: NEGATIVE
Glucose, UA: NEGATIVE
Spec Grav, UA: 1.015
pH, UA: 7

## 2013-05-07 NOTE — Progress Notes (Signed)
Pulse-96 No complaints.  

## 2013-05-14 ENCOUNTER — Encounter: Payer: Medicaid Other | Admitting: Obstetrics

## 2013-05-16 ENCOUNTER — Encounter: Payer: Medicaid Other | Admitting: Obstetrics

## 2013-05-21 ENCOUNTER — Ambulatory Visit (INDEPENDENT_AMBULATORY_CARE_PROVIDER_SITE_OTHER): Payer: Medicaid Other | Admitting: Obstetrics

## 2013-05-21 ENCOUNTER — Encounter: Payer: Self-pay | Admitting: Obstetrics

## 2013-05-21 VITALS — BP 129/80 | Temp 97.8°F | Wt 183.4 lb

## 2013-05-21 DIAGNOSIS — Z3483 Encounter for supervision of other normal pregnancy, third trimester: Secondary | ICD-10-CM

## 2013-05-21 DIAGNOSIS — Z3480 Encounter for supervision of other normal pregnancy, unspecified trimester: Secondary | ICD-10-CM

## 2013-05-21 NOTE — Progress Notes (Signed)
Pulse: 89

## 2013-05-22 LAB — POCT URINALYSIS DIPSTICK
Blood, UA: NEGATIVE
Glucose, UA: NEGATIVE
Ketones, UA: NEGATIVE
Spec Grav, UA: 1.01

## 2013-05-27 ENCOUNTER — Ambulatory Visit (INDEPENDENT_AMBULATORY_CARE_PROVIDER_SITE_OTHER): Payer: Medicaid Other | Admitting: Advanced Practice Midwife

## 2013-05-27 ENCOUNTER — Encounter: Payer: Self-pay | Admitting: Advanced Practice Midwife

## 2013-05-27 VITALS — BP 134/82 | Temp 98.5°F | Wt 183.4 lb

## 2013-05-27 DIAGNOSIS — Z8759 Personal history of other complications of pregnancy, childbirth and the puerperium: Secondary | ICD-10-CM

## 2013-05-27 DIAGNOSIS — Z98891 History of uterine scar from previous surgery: Secondary | ICD-10-CM | POA: Insufficient documentation

## 2013-05-27 DIAGNOSIS — Z3483 Encounter for supervision of other normal pregnancy, third trimester: Secondary | ICD-10-CM

## 2013-05-27 DIAGNOSIS — Z3480 Encounter for supervision of other normal pregnancy, unspecified trimester: Secondary | ICD-10-CM

## 2013-05-27 DIAGNOSIS — D649 Anemia, unspecified: Secondary | ICD-10-CM

## 2013-05-27 LAB — POCT URINALYSIS DIPSTICK
Glucose, UA: NEGATIVE
Ketones, UA: NEGATIVE
Leukocytes, UA: NEGATIVE

## 2013-05-27 NOTE — Progress Notes (Signed)
Pulse: 91

## 2013-05-27 NOTE — Progress Notes (Signed)
Patient doing well. Discussed risk of VBAC vs. C-section, patient reports understanding. No concerns today.  Caedence Snowden Wilson Singer CNM

## 2013-05-28 ENCOUNTER — Encounter: Payer: Medicaid Other | Admitting: Obstetrics

## 2013-06-03 ENCOUNTER — Ambulatory Visit (INDEPENDENT_AMBULATORY_CARE_PROVIDER_SITE_OTHER): Payer: Medicaid Other | Admitting: Obstetrics

## 2013-06-03 VITALS — BP 129/79 | Temp 98.4°F | Wt 186.0 lb

## 2013-06-03 DIAGNOSIS — Z3483 Encounter for supervision of other normal pregnancy, third trimester: Secondary | ICD-10-CM

## 2013-06-03 DIAGNOSIS — Z3480 Encounter for supervision of other normal pregnancy, unspecified trimester: Secondary | ICD-10-CM

## 2013-06-03 LAB — POCT URINALYSIS DIPSTICK
Bilirubin, UA: NEGATIVE
Glucose, UA: NEGATIVE
Nitrite, UA: NEGATIVE
pH, UA: 7

## 2013-06-03 NOTE — Progress Notes (Signed)
Pulse- 88.  +back pain.

## 2013-06-03 NOTE — Progress Notes (Signed)
NST Reactive

## 2013-06-06 ENCOUNTER — Encounter (HOSPITAL_COMMUNITY): Payer: Self-pay | Admitting: Anesthesiology

## 2013-06-06 ENCOUNTER — Inpatient Hospital Stay (HOSPITAL_COMMUNITY)
Admission: AD | Admit: 2013-06-06 | Discharge: 2013-06-08 | DRG: 775 | Disposition: A | Discharge status: Home / Self Care | Payer: Medicaid Other | Source: Ambulatory Visit | Attending: Obstetrics | Admitting: Obstetrics

## 2013-06-06 ENCOUNTER — Inpatient Hospital Stay (HOSPITAL_COMMUNITY): Payer: Medicaid Other | Admitting: Anesthesiology

## 2013-06-06 ENCOUNTER — Encounter (HOSPITAL_COMMUNITY): Payer: Self-pay | Admitting: *Deleted

## 2013-06-06 DIAGNOSIS — Z98891 History of uterine scar from previous surgery: Secondary | ICD-10-CM

## 2013-06-06 DIAGNOSIS — O34219 Maternal care for unspecified type scar from previous cesarean delivery: Principal | ICD-10-CM | POA: Diagnosis present

## 2013-06-06 DIAGNOSIS — Z8759 Personal history of other complications of pregnancy, childbirth and the puerperium: Secondary | ICD-10-CM

## 2013-06-06 DIAGNOSIS — D649 Anemia, unspecified: Secondary | ICD-10-CM

## 2013-06-06 LAB — CBC
Hemoglobin: 10 g/dL — ABNORMAL LOW (ref 12.0–15.0)
MCH: 25.5 pg — ABNORMAL LOW (ref 26.0–34.0)
MCV: 77.8 fL — ABNORMAL LOW (ref 78.0–100.0)
Platelets: 213 10*3/uL (ref 150–400)
RBC: 3.92 MIL/uL (ref 3.87–5.11)
WBC: 8.8 10*3/uL (ref 4.0–10.5)

## 2013-06-06 LAB — TYPE AND SCREEN: Antibody Screen: NEGATIVE

## 2013-06-06 MED ORDER — IBUPROFEN 600 MG PO TABS: 600.0000 mg | ORAL_TABLET | Freq: Four times a day (QID) | ORAL | Status: DC | PRN

## 2013-06-06 MED ORDER — ACETAMINOPHEN 325 MG PO TABS: 650.0000 mg | ORAL_TABLET | ORAL | Status: DC | PRN

## 2013-06-06 MED ORDER — ONDANSETRON HCL 4 MG PO TABS: 4.0000 mg | ORAL_TABLET | ORAL | Status: DC | PRN

## 2013-06-06 MED ORDER — NALBUPHINE SYRINGE 5 MG/0.5 ML
10.0000 mg | INJECTION | Freq: Four times a day (QID) | INTRAMUSCULAR | Status: DC | PRN
  Filled 2013-06-06: qty 1

## 2013-06-06 MED ORDER — WITCH HAZEL-GLYCERIN EX PADS: 1.0000 "application " | MEDICATED_PAD | CUTANEOUS | Status: DC | PRN

## 2013-06-06 MED ORDER — OXYCODONE-ACETAMINOPHEN 5-325 MG PO TABS: 1.0000 | ORAL_TABLET | ORAL | Status: DC | PRN

## 2013-06-06 MED ORDER — MEDROXYPROGESTERONE ACETATE 150 MG/ML IM SUSP: 150.0000 mg | INTRAMUSCULAR | Status: DC | PRN

## 2013-06-06 MED ORDER — EPHEDRINE 5 MG/ML INJ
10.0000 mg | INTRAVENOUS | Status: DC | PRN
  Filled 2013-06-06: qty 4
  Filled 2013-06-06: qty 2

## 2013-06-06 MED ORDER — LIDOCAINE HCL (PF) 1 % IJ SOLN
INTRAMUSCULAR | Status: DC | PRN
  Administered 2013-06-06 (×2): 5 mL

## 2013-06-06 MED ORDER — EPHEDRINE 5 MG/ML INJ
10.0000 mg | INTRAVENOUS | Status: DC | PRN
  Filled 2013-06-06: qty 2

## 2013-06-06 MED ORDER — ONDANSETRON HCL 4 MG/2ML IJ SOLN: 4.0000 mg | Freq: Four times a day (QID) | INTRAMUSCULAR | Status: DC | PRN

## 2013-06-06 MED ORDER — PHENYLEPHRINE 40 MCG/ML (10ML) SYRINGE FOR IV PUSH (FOR BLOOD PRESSURE SUPPORT)
80.0000 ug | PREFILLED_SYRINGE | INTRAVENOUS | Status: DC | PRN
  Filled 2013-06-06: qty 2

## 2013-06-06 MED ORDER — SIMETHICONE 80 MG PO CHEW: 80.0000 mg | CHEWABLE_TABLET | ORAL | Status: DC | PRN

## 2013-06-06 MED ORDER — BUTORPHANOL TARTRATE 1 MG/ML IJ SOLN: 1.0000 mg | INTRAMUSCULAR | Status: DC | PRN

## 2013-06-06 MED ORDER — DIPHENHYDRAMINE HCL 25 MG PO CAPS: 25.0000 mg | ORAL_CAPSULE | Freq: Four times a day (QID) | ORAL | Status: DC | PRN

## 2013-06-06 MED ORDER — PROMETHAZINE HCL 25 MG/ML IJ SOLN: 25.0000 mg | Freq: Four times a day (QID) | INTRAMUSCULAR | Status: DC | PRN

## 2013-06-06 MED ORDER — CITRIC ACID-SODIUM CITRATE 334-500 MG/5ML PO SOLN: 30.0000 mL | ORAL | Status: DC | PRN

## 2013-06-06 MED ORDER — IBUPROFEN 600 MG PO TABS
600.0000 mg | ORAL_TABLET | Freq: Four times a day (QID) | ORAL | Status: DC
  Administered 2013-06-06 – 2013-06-08 (×7): 600 mg via ORAL
  Filled 2013-06-06 (×7): qty 1

## 2013-06-06 MED ORDER — TERBUTALINE SULFATE 1 MG/ML IJ SOLN: 0.2500 mg | Freq: Once | INTRAMUSCULAR | Status: DC | PRN

## 2013-06-06 MED ORDER — OXYTOCIN 40 UNITS IN LACTATED RINGERS INFUSION - SIMPLE MED
62.5000 mL/h | INTRAVENOUS | Status: DC
  Filled 2013-06-06: qty 1000

## 2013-06-06 MED ORDER — PHENYLEPHRINE 40 MCG/ML (10ML) SYRINGE FOR IV PUSH (FOR BLOOD PRESSURE SUPPORT)
80.0000 ug | PREFILLED_SYRINGE | INTRAVENOUS | Status: DC | PRN
  Filled 2013-06-06: qty 2
  Filled 2013-06-06: qty 5

## 2013-06-06 MED ORDER — PRENATAL MULTIVITAMIN CH
1.0000 | ORAL_TABLET | Freq: Every day | ORAL | Status: DC
  Administered 2013-06-07 – 2013-06-08 (×2): 1 via ORAL
  Filled 2013-06-06 (×2): qty 1

## 2013-06-06 MED ORDER — SENNOSIDES-DOCUSATE SODIUM 8.6-50 MG PO TABS
2.0000 | ORAL_TABLET | ORAL | Status: DC
  Administered 2013-06-06 – 2013-06-07 (×2): 2 via ORAL

## 2013-06-06 MED ORDER — LIDOCAINE HCL (PF) 1 % IJ SOLN
30.0000 mL | INTRAMUSCULAR | Status: DC | PRN
  Filled 2013-06-06 (×2): qty 30

## 2013-06-06 MED ORDER — ZOLPIDEM TARTRATE 5 MG PO TABS: 5.0000 mg | ORAL_TABLET | Freq: Every evening | ORAL | Status: DC | PRN

## 2013-06-06 MED ORDER — DIBUCAINE 1 % RE OINT: 1.0000 "application " | TOPICAL_OINTMENT | RECTAL | Status: DC | PRN

## 2013-06-06 MED ORDER — ONDANSETRON HCL 4 MG/2ML IJ SOLN: 4.0000 mg | INTRAMUSCULAR | Status: DC | PRN

## 2013-06-06 MED ORDER — LACTATED RINGERS IV SOLN
500.0000 mL | Freq: Once | INTRAVENOUS | Status: AC
  Administered 2013-06-06: 13:00:00 via INTRAVENOUS

## 2013-06-06 MED ORDER — OXYTOCIN 40 UNITS IN LACTATED RINGERS INFUSION - SIMPLE MED: 62.5000 mL/h | INTRAVENOUS | Status: DC | PRN

## 2013-06-06 MED ORDER — OXYTOCIN 40 UNITS IN LACTATED RINGERS INFUSION - SIMPLE MED
1.0000 m[IU]/min | INTRAVENOUS | Status: DC
  Administered 2013-06-06: 3 m[IU]/min via INTRAVENOUS
  Administered 2013-06-06: 2 m[IU]/min via INTRAVENOUS

## 2013-06-06 MED ORDER — BENZOCAINE-MENTHOL 20-0.5 % EX AERO: 1.0000 "application " | INHALATION_SPRAY | CUTANEOUS | Status: DC | PRN

## 2013-06-06 MED ORDER — DIPHENHYDRAMINE HCL 50 MG/ML IJ SOLN: 12.5000 mg | INTRAMUSCULAR | Status: DC | PRN

## 2013-06-06 MED ORDER — LACTATED RINGERS IV SOLN
500.0000 mL | INTRAVENOUS | Status: DC | PRN
  Administered 2013-06-06 (×2): 1000 mL via INTRAVENOUS

## 2013-06-06 MED ORDER — FENTANYL 2.5 MCG/ML BUPIVACAINE 1/10 % EPIDURAL INFUSION (WH - ANES)
14.0000 mL/h | INTRAMUSCULAR | Status: DC | PRN
  Administered 2013-06-06: 14 mL/h via EPIDURAL
  Filled 2013-06-06: qty 125

## 2013-06-06 MED ORDER — OXYTOCIN BOLUS FROM INFUSION: 500.0000 mL | INTRAVENOUS | Status: DC

## 2013-06-06 MED ORDER — LANOLIN HYDROUS EX OINT: TOPICAL_OINTMENT | CUTANEOUS | Status: DC | PRN

## 2013-06-06 MED ORDER — TETANUS-DIPHTH-ACELL PERTUSSIS 5-2.5-18.5 LF-MCG/0.5 IM SUSP
0.5000 mL | Freq: Once | INTRAMUSCULAR | Status: AC
  Administered 2013-06-08: 0.5 mL via INTRAMUSCULAR
  Filled 2013-06-06: qty 0.5

## 2013-06-06 MED ORDER — NALBUPHINE SYRINGE 5 MG/0.5 ML
10.0000 mg | INJECTION | INTRAMUSCULAR | Status: DC | PRN
  Filled 2013-06-06: qty 1

## 2013-06-06 MED ORDER — OXYCODONE-ACETAMINOPHEN 5-325 MG PO TABS
1.0000 | ORAL_TABLET | ORAL | Status: DC | PRN
  Administered 2013-06-07 (×2): 1 via ORAL
  Administered 2013-06-08 (×2): 2 via ORAL
  Filled 2013-06-06 (×4): qty 1
  Filled 2013-06-06: qty 2

## 2013-06-06 NOTE — H&P (Signed)
Tracy Waller is a 22 y.o. female presenting for UC's. Maternal Medical History:  Reason for admission: Contractions.  22 yo G3 P2.  EDC 06-01-13.  H/O NSVD in 2009  followed by C/S in 2011 for LGA  fetus and concerns for dystocia.  Desires TOLAC.  Presents with UC's.  Contractions: Onset was 6-12 hours ago.   Frequency: regular.   Perceived severity is moderate.    Fetal activity: Perceived fetal activity is normal.    Prenatal complications: no prenatal complications Prenatal Complications - Diabetes: none.    OB History   Grav Para Term Preterm Abortions TAB SAB Ect Mult Living   3 2 2       2      Past Medical History  Diagnosis Date  . Hypertension   . No pertinent past medical history    Past Surgical History  Procedure Laterality Date  . Cesarean section     Family History: family history includes Asthma in her father; Hypertension in her father, mother, and sister. Social History:  reports that she has never smoked. She has never used smokeless tobacco. She reports that she does not drink alcohol or use illicit drugs.   Prenatal Transfer Tool  Maternal Diabetes: No Genetic Screening: Normal Maternal Ultrasounds/Referrals: Normal Fetal Ultrasounds or other Referrals:  None Maternal Substance Abuse:  No Significant Maternal Medications:  None Significant Maternal Lab Results:  None Other Comments:  None  Review of Systems  All other systems reviewed and are negative.    Dilation: 5.5 Effacement (%): 70 Station: -2 Exam by:: Artelia Laroche, CNM/S. [name], RNC Blood pressure 143/67, pulse 67, temperature 98.3 F (36.8 C), temperature source Oral, resp. rate 20, height 5\' 2"  (1.575 m), weight 186 lb (84.369 kg), last menstrual period 08/19/2012. Maternal Exam:  Abdomen: Patient reports no abdominal tenderness. Surgical scars: low transverse.   Fetal presentation: vertex  Pelvis: adequate for delivery.   Cervix: Cervix evaluated by digital exam.      Physical Exam  Nursing note and vitals reviewed. Constitutional: She is oriented to person, place, and time. She appears well-developed and well-nourished.  HENT:  Head: Normocephalic and atraumatic.  Eyes: Conjunctivae are normal. Pupils are equal, round, and reactive to light.  Neck: Normal range of motion. Neck supple.  Cardiovascular: Normal rate and regular rhythm.   Respiratory: Effort normal and breath sounds normal.  GI: Soft.  Musculoskeletal: Normal range of motion.  Neurological: She is alert and oriented to person, place, and time.  Skin: Skin is warm and dry.  Psychiatric: She has a normal mood and affect. Her behavior is normal. Judgment and thought content normal.    Prenatal labs: ABO, Rh: O/POS/-- (05/29 1418) Antibody: NEG (05/29 1418) Rubella: 1.67 (05/29 1418) RPR: NON REAC (06/03 1216)  HBsAg: NEGATIVE (05/29 1418)  HIV: NON REACTIVE (06/03 1216)  GBS: NEGATIVE (08/13 1320)   Assessment/Plan: 40.5 weeks.  Active labor.  Previous C/S.  OK for TOLAC.   Makhai Fulco A 06/06/2013, 10:27 AM

## 2013-06-06 NOTE — Progress Notes (Signed)
Pt offered pain relief and epidural for pain rated a '10'-pt declined and stated she would let RN know when she was ready.

## 2013-06-06 NOTE — Progress Notes (Signed)
Tracy Waller is a 22 y.o. G3P2002 at [redacted]w[redacted]d by LMP admitted for active labor  Subjective:   Objective: BP 151/84  Pulse 69  Temp(Src) 98 F (36.7 C) (Oral)  Resp 18  Ht 5\' 2"  (1.575 m)  Wt 186 lb (84.369 kg)  BMI 34.01 kg/m2  SpO2 99%  LMP 08/19/2012   Total I/O In: -  Out: 450 [Urine:450]  FHT:  FHR: 120-130 bpm, variability: minimal ,  accelerations:  Abscent,  decelerations:  Absent UC:   regular, every 2-3 minutes SVE:   Dilation: 8.5 Effacement (%): 90 Station: -1;0 Exam by:: Rzhang,rnc-ob  Labs: Lab Results  Component Value Date   WBC 8.8 06/06/2013   HGB 10.0* 06/06/2013   HCT 30.5* 06/06/2013   MCV 77.8* 06/06/2013   PLT 213 06/06/2013    Assessment / Plan: Spontaneous labor, progressing normally  Labor: Progressing normally Preeclampsia:  n/a Fetal Wellbeing:  Category I Pain Control:  Epidural I/D:  n/a Anticipated MOD:  NSVD  Tracy Waller A 06/06/2013, 5:21 PM

## 2013-06-06 NOTE — Anesthesia Procedure Notes (Signed)
Epidural Patient location during procedure: OB Start time: 06/06/2013 12:51 PM  Staffing Anesthesiologist: Angus Seller., Harrell Gave. Performed by: anesthesiologist   Preanesthetic Checklist Completed: patient identified, site marked, surgical consent, pre-op evaluation, timeout performed, IV checked, risks and benefits discussed and monitors and equipment checked  Epidural Patient position: sitting Prep: site prepped and draped and DuraPrep Patient monitoring: continuous pulse ox and blood pressure Approach: midline Injection technique: LOR air  Needle:  Needle type: Tuohy  Needle gauge: 17 G Needle length: 9 cm and 9 Needle insertion depth: 5 cm cm Catheter type: closed end flexible Catheter size: 19 Gauge Catheter at skin depth: 10 cm Test dose: negative  Assessment Events: blood not aspirated, injection not painful, no injection resistance, negative IV test and no paresthesia  Additional Notes Patient identified.  Risk benefits discussed including failed block, incomplete pain control, headache, nerve damage, paralysis, blood pressure changes, nausea, vomiting, reactions to medication both toxic or allergic, and postpartum back pain.  Patient expressed understanding and wished to proceed.  All questions were answered.  Sterile technique used throughout procedure and epidural site dressed with sterile barrier dressing. No paresthesia or other complications noted.The patient did not experience any signs of intravascular injection such as tinnitus or metallic taste in mouth nor signs of intrathecal spread such as rapid motor block. Please see nursing notes for vital signs.

## 2013-06-06 NOTE — Progress Notes (Signed)
Tracy Waller is a 22 y.o. G3P2002 at [redacted]w[redacted]d by LMP admitted for active labor  Subjective:   Objective: BP 132/70  Pulse 64  Temp(Src) 98.3 F (36.8 C) (Oral)  Resp 18  Ht 5\' 2"  (1.575 m)  Wt 186 lb (84.369 kg)  BMI 34.01 kg/m2  LMP 08/19/2012      FHT:  FHR: 150-160 bpm, variability: moderate,  accelerations:  Present,  decelerations:  Absent UC:   regular, every 2-3 minutes SVE:   Dilation: 5.5 Effacement (%): 70 Station: -2 Exam by:: Artelia Laroche, CNM/S. Gabriel Earing, RNC  Labs: Lab Results  Component Value Date   WBC 8.8 06/06/2013   HGB 10.0* 06/06/2013   HCT 30.5* 06/06/2013   MCV 77.8* 06/06/2013   PLT 213 06/06/2013    Assessment / Plan: Spontaneous labor, progressing normally  Labor: Progressing normally Preeclampsia:  n/a Fetal Wellbeing:  Category I Pain Control:  Labor support without medications I/D:  n/a Anticipated MOD:  NSVD  Salvadore Valvano A 06/06/2013, 10:49 AM

## 2013-06-06 NOTE — MAU Note (Signed)
UC's since last night. Had some spotting last night.

## 2013-06-06 NOTE — Anesthesia Preprocedure Evaluation (Signed)
Anesthesia Evaluation  Patient identified by MRN, date of birth, ID band Patient awake    Reviewed: Allergy & Precautions, H&P , Patient's Chart, lab work & pertinent test results  Airway Mallampati: II TM Distance: >3 FB Neck ROM: full    Dental no notable dental hx.    Pulmonary neg pulmonary ROS,  breath sounds clear to auscultation  Pulmonary exam normal       Cardiovascular hypertension, negative cardio ROS  Rhythm:regular Rate:Normal     Neuro/Psych negative neurological ROS  negative psych ROS   GI/Hepatic negative GI ROS, Neg liver ROS, GERD-  ,  Endo/Other  negative endocrine ROS  Renal/GU negative Renal ROS     Musculoskeletal   Abdominal   Peds  Hematology negative hematology ROS (+) anemia ,   Anesthesia Other Findings   Reproductive/Obstetrics (+) Pregnancy                           Anesthesia Physical Anesthesia Plan  ASA: III  Anesthesia Plan: Epidural   Post-op Pain Management:    Induction:   Airway Management Planned:   Additional Equipment:   Intra-op Plan:   Post-operative Plan:   Informed Consent: I have reviewed the patients History and Physical, chart, labs and discussed the procedure including the risks, benefits and alternatives for the proposed anesthesia with the patient or authorized representative who has indicated his/her understanding and acceptance.     Plan Discussed with:   Anesthesia Plan Comments:         Anesthesia Quick Evaluation

## 2013-06-07 ENCOUNTER — Encounter (HOSPITAL_COMMUNITY): Payer: Self-pay | Admitting: *Deleted

## 2013-06-07 LAB — CBC
HCT: 28.3 % — ABNORMAL LOW (ref 36.0–46.0)
Hemoglobin: 9.3 g/dL — ABNORMAL LOW (ref 12.0–15.0)
MCH: 25.5 pg — ABNORMAL LOW (ref 26.0–34.0)
MCHC: 32.9 g/dL (ref 30.0–36.0)
RDW: 14.4 % (ref 11.5–15.5)

## 2013-06-07 NOTE — Progress Notes (Signed)
Post Partum Day 1 Subjective: no complaints  Objective: Blood pressure 114/72, pulse 80, temperature 98.2 F (36.8 C), temperature source Oral, resp. rate 16, height 5\' 2"  (1.575 m), weight 186 lb (84.369 kg), last menstrual period 08/19/2012, SpO2 99.00%, unknown if currently breastfeeding.  Physical Exam:  General: alert and no distress Lochia: appropriate Uterine Fundus: firm Incision: none DVT Evaluation: No evidence of DVT seen on physical exam.   Recent Labs  06/06/13 0938 06/07/13 0652  HGB 10.0* 9.3*  HCT 30.5* 28.3*    Assessment/Plan: Plan for discharge tomorrow   LOS: 1 day   HARPER,CHARLES A 06/07/2013, 8:07 AM

## 2013-06-07 NOTE — Anesthesia Postprocedure Evaluation (Signed)
  Anesthesia Post-op Note  Patient: Tracy Waller  Procedure(s) Performed: * No procedures listed *  Patient Location: Mother/Baby  Anesthesia Type:Epidural  Level of Consciousness: awake and alert   Airway and Oxygen Therapy: Patient Spontanous Breathing  Post-op Pain: mild  Post-op Assessment: Patient's Cardiovascular Status Stable, Respiratory Function Stable, No signs of Nausea or vomiting, Pain level controlled, No headache, No residual numbness and No residual motor weakness  Post-op Vital Signs: stable  Complications: No apparent anesthesia complications

## 2013-06-08 MED ORDER — OXYCODONE-ACETAMINOPHEN 5-325 MG PO TABS: 1.0000 | ORAL_TABLET | ORAL | Status: DC | PRN

## 2013-06-08 MED ORDER — IBUPROFEN 800 MG PO TABS: 800.0000 mg | ORAL_TABLET | Freq: Three times a day (TID) | ORAL | Status: DC | PRN

## 2013-06-08 MED ORDER — IBUPROFEN 600 MG PO TABS: 600.0000 mg | ORAL_TABLET | Freq: Four times a day (QID) | ORAL | Status: DC | PRN

## 2013-06-08 MED ORDER — FUSION PLUS PO CAPS: 1.0000 | ORAL_CAPSULE | Freq: Every day | ORAL | Status: DC

## 2013-06-08 NOTE — Discharge Summary (Signed)
Obstetric Discharge Summary Reason for Admission: onset of labor Prenatal Procedures: ultrasound Intrapartum Procedures: spontaneous vaginal delivery Postpartum Procedures: none Complications-Operative and Postpartum: none Hemoglobin  Date Value Range Status  06/07/2013 9.3* 12.0 - 15.0 g/dL Final     HCT  Date Value Range Status  06/07/2013 28.3* 36.0 - 46.0 % Final    Physical Exam:  General: alert and no distress Lochia: appropriate Uterine Fundus: firm Incision: healing well DVT Evaluation: No evidence of DVT seen on physical exam.  Discharge Diagnoses: Term Pregnancy-delivered  Discharge Information: Date: 06/08/2013 Activity: pelvic rest Diet: routine Medications: PNV, Ibuprofen, Colace, Iron and Percocet Condition: stable Instructions: refer to practice specific booklet Discharge to: home Follow-up Information   Follow up with Lige Lakeman A, MD. Schedule an appointment as soon as possible for Waller visit in 2 weeks.   Specialty:  Obstetrics and Gynecology   Contact information:   [address] [place] [address] [phone]       Newborn Data: Live born female  Birth Weight: 7 lb 11.2 oz (3493 g) APGAR: 9, 10  Home with mother.  Tracy Waller 06/08/2013, 5:18 AM

## 2013-06-08 NOTE — Progress Notes (Signed)
Post Partum Day 2 Subjective: no complaints  Objective: Blood pressure 134/78, pulse 78, temperature 98.2 F (36.8 C), temperature source Oral, resp. rate 18, height 5\' 2"  (1.575 m), weight 186 lb (84.369 kg), last menstrual period 08/19/2012, SpO2 97.00%, unknown if currently breastfeeding.  Physical Exam:  General: alert and no distress Lochia: appropriate Uterine Fundus: firm Incision: healing well DVT Evaluation: No evidence of DVT seen on physical exam.   Recent Labs  06/06/13 0938 06/07/13 0652  HGB 10.0* 9.3*  HCT 30.5* 28.3*    Assessment/Plan: Discharge home   LOS: 2 days   Tayveon Lombardo A 06/08/2013, 5:11 AM

## 2013-06-09 ENCOUNTER — Encounter: Payer: Medicaid Other | Admitting: Obstetrics

## 2013-06-10 NOTE — Progress Notes (Signed)
Post discharge chart review completed.  

## 2013-07-29 ENCOUNTER — Ambulatory Visit: Payer: Medicaid Other | Admitting: Obstetrics

## 2013-09-13 ENCOUNTER — Encounter (HOSPITAL_BASED_OUTPATIENT_CLINIC_OR_DEPARTMENT_OTHER): Payer: Self-pay | Admitting: Emergency Medicine

## 2013-09-13 ENCOUNTER — Emergency Department (HOSPITAL_BASED_OUTPATIENT_CLINIC_OR_DEPARTMENT_OTHER)
Admission: EM | Admit: 2013-09-13 | Discharge: 2013-09-13 | Disposition: A | Discharge status: Home / Self Care | Payer: No Typology Code available for payment source | Attending: Emergency Medicine | Admitting: Emergency Medicine

## 2013-09-13 ENCOUNTER — Emergency Department (HOSPITAL_BASED_OUTPATIENT_CLINIC_OR_DEPARTMENT_OTHER): Payer: No Typology Code available for payment source

## 2013-09-13 DIAGNOSIS — S0083XA Contusion of other part of head, initial encounter: Secondary | ICD-10-CM

## 2013-09-13 DIAGNOSIS — S0993XA Unspecified injury of face, initial encounter: Secondary | ICD-10-CM | POA: Insufficient documentation

## 2013-09-13 DIAGNOSIS — S20219A Contusion of unspecified front wall of thorax, initial encounter: Secondary | ICD-10-CM | POA: Insufficient documentation

## 2013-09-13 DIAGNOSIS — S20211A Contusion of right front wall of thorax, initial encounter: Secondary | ICD-10-CM

## 2013-09-13 DIAGNOSIS — Y92410 Unspecified street and highway as the place of occurrence of the external cause: Secondary | ICD-10-CM | POA: Insufficient documentation

## 2013-09-13 DIAGNOSIS — Y9389 Activity, other specified: Secondary | ICD-10-CM | POA: Insufficient documentation

## 2013-09-13 DIAGNOSIS — I10 Essential (primary) hypertension: Secondary | ICD-10-CM | POA: Insufficient documentation

## 2013-09-13 MED ORDER — NAPROXEN 375 MG PO TABS: 375.0000 mg | ORAL_TABLET | Freq: Two times a day (BID) | ORAL | Status: DC
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
## 2014-04-08 ENCOUNTER — Emergency Department (HOSPITAL_BASED_OUTPATIENT_CLINIC_OR_DEPARTMENT_OTHER)
Admission: EM | Admit: 2014-04-08 | Discharge: 2014-04-09 | Disposition: A | Discharge status: Home / Self Care | Payer: Medicaid Other | Attending: Emergency Medicine | Admitting: Emergency Medicine

## 2014-04-08 ENCOUNTER — Encounter (HOSPITAL_BASED_OUTPATIENT_CLINIC_OR_DEPARTMENT_OTHER): Payer: Self-pay | Admitting: Emergency Medicine

## 2014-04-08 DIAGNOSIS — O169 Unspecified maternal hypertension, unspecified trimester: Secondary | ICD-10-CM | POA: Insufficient documentation

## 2014-04-08 DIAGNOSIS — O2341 Unspecified infection of urinary tract in pregnancy, first trimester: Secondary | ICD-10-CM

## 2014-04-08 DIAGNOSIS — Z791 Long term (current) use of non-steroidal anti-inflammatories (NSAID): Secondary | ICD-10-CM | POA: Insufficient documentation

## 2014-04-08 DIAGNOSIS — N390 Urinary tract infection, site not specified: Secondary | ICD-10-CM | POA: Insufficient documentation

## 2014-04-08 DIAGNOSIS — O2390 Unspecified genitourinary tract infection in pregnancy, unspecified trimester: Secondary | ICD-10-CM | POA: Insufficient documentation

## 2014-04-08 NOTE — ED Notes (Signed)
Pt c/o vaginal itching and burning x 2 days

## 2014-04-09 LAB — URINALYSIS, ROUTINE W REFLEX MICROSCOPIC
Bilirubin Urine: NEGATIVE
Glucose, UA: NEGATIVE mg/dL
Hgb urine dipstick: NEGATIVE
Ketones, ur: 15 mg/dL — AB
NITRITE: NEGATIVE
PH: 6.5 (ref 5.0–8.0)
Protein, ur: NEGATIVE mg/dL
SPECIFIC GRAVITY, URINE: 1.029 (ref 1.005–1.030)
UROBILINOGEN UA: 1 mg/dL (ref 0.0–1.0)

## 2014-04-09 LAB — URINE MICROSCOPIC-ADD ON

## 2014-04-09 LAB — WET PREP, GENITAL
CLUE CELLS WET PREP: NONE SEEN
TRICH WET PREP: NONE SEEN
YEAST WET PREP: NONE SEEN

## 2014-04-09 LAB — GC/CHLAMYDIA PROBE AMP
CT Probe RNA: NEGATIVE
GC Probe RNA: NEGATIVE

## 2014-04-09 LAB — PREGNANCY, URINE: PREG TEST UR: POSITIVE — AB

## 2014-04-09 MED ORDER — NITROFURANTOIN MONOHYD MACRO 100 MG PO CAPS
100.0000 mg | ORAL_CAPSULE | Freq: Once | ORAL | Status: AC
  Administered 2014-04-09: 100 mg via ORAL
  Filled 2014-04-09: qty 1

## 2014-04-09 MED ORDER — NITROFURANTOIN MONOHYD MACRO 100 MG PO CAPS: 100.0000 mg | ORAL_CAPSULE | Freq: Two times a day (BID) | ORAL | Status: DC

## 2014-04-09 NOTE — ED Provider Notes (Signed)
CSN: [id number]     Arrival date & time 04/08/14  2348 History   First MD Initiated Contact with Patient 04/09/14 0104     Chief Complaint  Patient presents with  . Vaginal Itching     (Consider location/radiation/quality/duration/timing/severity/associated sxs/prior Treatment) HPI This is a 23 year old female with a two-day history of burning and itching of the vulva. This is worse with urination. Symptoms are moderate. She is having more discharge than usual but no vaginal bleeding. She denies fever, nausea, vomiting or diarrhea. She has had chills. She is not aware of being pregnant but states that she might be. She is having no abnormal pain.  Past Medical History  Diagnosis Date  . Hypertension   . No pertinent past medical history    Past Surgical History  Procedure Laterality Date  . Cesarean section     Family History  Problem Relation Age of Onset  . Asthma Father   . Hypertension Father   . Hypertension Mother   . Hypertension Sister    History  Substance Use Topics  . Smoking status: Never Smoker   . Smokeless tobacco: Never Used  . Alcohol Use: No     Comment: occasionally   OB History   Grav Para Term Preterm Abortions TAB SAB Ect Mult Living   3 3 3       3      Review of Systems  All other systems reviewed and are negative.   Allergies  Review of patient's allergies indicates no known allergies.  Home Medications   Prior to Admission medications   Medication Sig Start Date End Date Taking? Authorizing Provider  naproxen (NAPROSYN) 375 MG tablet Take 1 tablet (375 mg total) by mouth 2 (two) times daily. 09/13/13   Rolan Bucco, MD   BP 127/69  Pulse 104  Temp(Src) 98.9 F (37.2 C) (Oral)  Resp 18  Ht 5\' 3"  (1.6 m)  Wt 156 lb (70.761 kg)  BMI 27.64 kg/m2  SpO2 100%  LMP 02/23/2014  Physical Exam General: Well-developed, well-nourished female in no acute distress; appearance consistent with age of record HENT: normocephalic;  atraumatic Eyes: pupils equal, round and reactive to light; extraocular muscles intact Neck: supple Heart: regular rate and rhythm Lungs: clear to auscultation bilaterally Abdomen: soft; nondistended; nontender; no masses or hepatosplenomegaly; bowel sounds present GU: No CVA tenderness; normal external genitalia; yellow, watery foul-smelling vaginal discharge; no vaginal bleeding; bladder tender; no cervical motion or adnexal tenderness Extremities: No deformity; full range of motion; pulses normal Neurologic: Awake, alert and oriented; motor function intact in all extremities and symmetric; no facial droop Skin: Warm and dry Psychiatric: Normal mood and affect    ED Course  Procedures (including critical care time)   MDM   Nursing notes and vitals signs, including pulse oximetry, reviewed.  Summary of this visit's results, reviewed by myself:  Labs:  Results for orders placed during the hospital encounter of 04/08/14 (from the past 24 hour(s))  URINALYSIS, ROUTINE W REFLEX MICROSCOPIC     Status: Abnormal   Collection Time    04/09/14 12:12 AM      Result Value Ref Range   Color, Urine YELLOW  YELLOW   APPearance CLOUDY (*) CLEAR   Specific Gravity, Urine 1.029  1.005 - 1.030   pH 6.5  5.0 - 8.0   Glucose, UA NEGATIVE  NEGATIVE mg/dL   Hgb urine dipstick NEGATIVE  NEGATIVE   Bilirubin Urine NEGATIVE  NEGATIVE   Ketones, ur 15 (*)  NEGATIVE mg/dL   Protein, ur NEGATIVE  NEGATIVE mg/dL   Urobilinogen, UA 1.0  0.0 - 1.0 mg/dL   Nitrite NEGATIVE  NEGATIVE   Leukocytes, UA MODERATE (*) NEGATIVE  PREGNANCY, URINE     Status: Abnormal   Collection Time    04/09/14 12:12 AM      Result Value Ref Range   Preg Test, Ur POSITIVE (*) NEGATIVE  URINE MICROSCOPIC-ADD ON     Status: Abnormal   Collection Time    04/09/14 12:12 AM      Result Value Ref Range   Squamous Epithelial / LPF FEW (*) RARE   WBC, UA 7-10  <3 WBC/hpf   RBC / HPF 0-2  <3 RBC/hpf   Bacteria, UA FEW (*)  RARE   Urine-Other MUCOUS PRESENT    WET PREP, GENITAL     Status: Abnormal   Collection Time    04/09/14  1:12 AM      Result Value Ref Range   Yeast Wet Prep HPF POC NONE SEEN  NONE SEEN   Trich, Wet Prep NONE SEEN  NONE SEEN   Clue Cells Wet Prep HPF POC NONE SEEN  NONE SEEN   WBC, Wet Prep HPF POC FEW (*) NONE SEEN   Will treat her for UTI in pregnancy and refer her to her Ob/Gyn.      Hanley SeamenJohn L Tayah Idrovo, MD 04/09/14 [phone]

## 2014-04-10 LAB — URINE CULTURE: Colony Count: >=100000

## 2014-04-11 ENCOUNTER — Telehealth (HOSPITAL_COMMUNITY): Payer: Self-pay

## 2014-05-31 ENCOUNTER — Emergency Department (HOSPITAL_BASED_OUTPATIENT_CLINIC_OR_DEPARTMENT_OTHER): Payer: No Typology Code available for payment source

## 2014-05-31 ENCOUNTER — Encounter (HOSPITAL_BASED_OUTPATIENT_CLINIC_OR_DEPARTMENT_OTHER): Payer: Self-pay | Admitting: Emergency Medicine

## 2014-05-31 ENCOUNTER — Emergency Department (HOSPITAL_BASED_OUTPATIENT_CLINIC_OR_DEPARTMENT_OTHER)
Admission: EM | Admit: 2014-05-31 | Discharge: 2014-05-31 | Disposition: A | Discharge status: Home / Self Care | Payer: No Typology Code available for payment source | Attending: Emergency Medicine | Admitting: Emergency Medicine

## 2014-05-31 DIAGNOSIS — O039 Complete or unspecified spontaneous abortion without complication: Secondary | ICD-10-CM | POA: Insufficient documentation

## 2014-05-31 DIAGNOSIS — Z87440 Personal history of urinary (tract) infections: Secondary | ICD-10-CM | POA: Insufficient documentation

## 2014-05-31 DIAGNOSIS — Z791 Long term (current) use of non-steroidal anti-inflammatories (NSAID): Secondary | ICD-10-CM | POA: Insufficient documentation

## 2014-05-31 DIAGNOSIS — O169 Unspecified maternal hypertension, unspecified trimester: Secondary | ICD-10-CM | POA: Insufficient documentation

## 2014-05-31 DIAGNOSIS — O209 Hemorrhage in early pregnancy, unspecified: Secondary | ICD-10-CM | POA: Insufficient documentation

## 2014-05-31 LAB — URINE MICROSCOPIC-ADD ON

## 2014-05-31 LAB — CBC WITH DIFFERENTIAL/PLATELET
Basophils Absolute: 0 10*3/uL (ref 0.0–0.1)
Basophils Relative: 0 % (ref 0–1)
Eosinophils Absolute: 0.2 10*3/uL (ref 0.0–0.7)
Eosinophils Relative: 2 % (ref 0–5)
HCT: 33.8 % — ABNORMAL LOW (ref 36.0–46.0)
Hemoglobin: 11.1 g/dL — ABNORMAL LOW (ref 12.0–15.0)
LYMPHS ABS: 3.2 10*3/uL (ref 0.7–4.0)
LYMPHS PCT: 42 % (ref 12–46)
MCH: 26.4 pg (ref 26.0–34.0)
MCHC: 32.8 g/dL (ref 30.0–36.0)
MCV: 80.3 fL (ref 78.0–100.0)
Monocytes Absolute: 0.6 10*3/uL (ref 0.1–1.0)
Monocytes Relative: 8 % (ref 3–12)
NEUTROS PCT: 48 % (ref 43–77)
Neutro Abs: 3.6 10*3/uL (ref 1.7–7.7)
PLATELETS: 281 10*3/uL (ref 150–400)
RBC: 4.21 MIL/uL (ref 3.87–5.11)
RDW: 14.9 % (ref 11.5–15.5)
WBC: 7.5 10*3/uL (ref 4.0–10.5)

## 2014-05-31 LAB — BASIC METABOLIC PANEL
Anion gap: 15 (ref 5–15)
BUN: 11 mg/dL (ref 6–23)
CO2: 22 meq/L (ref 19–32)
Calcium: 9.1 mg/dL (ref 8.4–10.5)
Chloride: 104 mEq/L (ref 96–112)
Creatinine, Ser: 0.7 mg/dL (ref 0.50–1.10)
GFR calc Af Amer: >90 mL/min (ref >90)
GFR calc non Af Amer: >90 mL/min (ref >90)
GLUCOSE: 99 mg/dL (ref 70–99)
POTASSIUM: 3.6 meq/L — AB (ref 3.7–5.3)
SODIUM: 141 meq/L (ref 137–147)

## 2014-05-31 LAB — URINALYSIS, ROUTINE W REFLEX MICROSCOPIC
Bilirubin Urine: NEGATIVE
Glucose, UA: NEGATIVE mg/dL
Ketones, ur: NEGATIVE mg/dL
Nitrite: NEGATIVE
PH: 6.5 (ref 5.0–8.0)
Protein, ur: NEGATIVE mg/dL
SPECIFIC GRAVITY, URINE: 1.027 (ref 1.005–1.030)
UROBILINOGEN UA: 1 mg/dL (ref 0.0–1.0)

## 2014-05-31 LAB — ABO/RH: ABO/RH(D): O POS

## 2014-05-31 LAB — HCG, QUANTITATIVE, PREGNANCY: HCG, BETA CHAIN, QUANT, S: 1 m[IU]/mL (ref <5)

## 2014-05-31 NOTE — ED Notes (Signed)
PT in ultrasound 

## 2014-05-31 NOTE — Discharge Instructions (Signed)
Please follow with your primary care doctor in the next 2 days for a check-up. They must obtain records for further management.   Do not hesitate to return to the Emergency Department for any new, worsening or concerning symptoms.    Miscarriage A miscarriage is the sudden loss of an unborn baby (fetus) before the 20th week of pregnancy. Most miscarriages happen in the first 3 months of pregnancy. Sometimes, it happens before a woman even knows she is pregnant. A miscarriage is also called a "spontaneous miscarriage" or "early pregnancy loss." Having a miscarriage can be an emotional experience. Talk with your caregiver about any questions you may have about miscarrying, the grieving process, and your future pregnancy plans. CAUSES   Problems with the fetal chromosomes that make it impossible for the baby to develop normally. Problems with the baby's genes or chromosomes are most often the result of errors that occur, by chance, as the embryo divides and grows. The problems are not inherited from the parents.  Infection of the cervix or uterus.   Hormone problems.   Problems with the cervix, such as having an incompetent cervix. This is when the tissue in the cervix is not strong enough to hold the pregnancy.   Problems with the uterus, such as an abnormally shaped uterus, uterine fibroids, or congenital abnormalities.   Certain medical conditions.   Smoking, drinking alcohol, or taking illegal drugs.   Trauma.  Often, the cause of a miscarriage is unknown.  SYMPTOMS   Vaginal bleeding or spotting, with or without cramps or pain.  Pain or cramping in the abdomen or lower back.  Passing fluid, tissue, or blood clots from the vagina. DIAGNOSIS  Your caregiver will perform a physical exam. You may also have an ultrasound to confirm the miscarriage. Blood or urine tests may also be ordered. TREATMENT   Sometimes, treatment is not necessary if you naturally pass all the fetal  tissue that was in the uterus. If some of the fetus or placenta remains in the body (incomplete miscarriage), tissue left behind may become infected and must be removed. Usually, a dilation and curettage (D and C) procedure is performed. During a D and C procedure, the cervix is widened (dilated) and any remaining fetal or placental tissue is gently removed from the uterus.  Antibiotic medicines are prescribed if there is an infection. Other medicines may be given to reduce the size of the uterus (contract) if there is a lot of bleeding.  If you have Rh negative blood and your baby was Rh positive, you will need a Rh immunoglobulin shot. This shot will protect any future baby from having Rh blood problems in future pregnancies. HOME CARE INSTRUCTIONS   Your caregiver may order bed rest or may allow you to continue light activity. Resume activity as directed by your caregiver.  Have someone help with home and family responsibilities during this time.   Keep track of the number of sanitary pads you use each day and how soaked (saturated) they are. Write down this information.   Do not use tampons. Do not douche or have sexual intercourse until approved by your caregiver.   Only take over-the-counter or prescription medicines for pain or discomfort as directed by your caregiver.   Do not take aspirin. Aspirin can cause bleeding.   Keep all follow-up appointments with your caregiver.   If you or your partner have problems with grieving, talk to your caregiver or seek counseling to help cope with the pregnancy  loss. Allow enough time to grieve before trying to get pregnant again.  SEEK IMMEDIATE MEDICAL CARE IF:   You have severe cramps or pain in your back or abdomen.  You have a fever.  You pass large blood clots (walnut-sized or larger) ortissue from your vagina. Save any tissue for your caregiver to inspect.   Your bleeding increases.   You have a thick, bad-smelling vaginal  discharge.  You become lightheaded, weak, or you faint.   You have chills.  MAKE SURE YOU:  Understand these instructions.  Will watch your condition.  Will get help right away if you are not doing well or get worse. Document Released: 03/07/2001 Document Revised: 01/06/2013 Document Reviewed: 10/31/2011 [facility] Patient Information 2015 [place], [place]. This information is not intended to replace advice given to you by your health care provider. Make sure you discuss any questions you have with your health care provider.

## 2014-05-31 NOTE — ED Provider Notes (Signed)
CSN: [id number]     Arrival date & time 05/31/14  1829 History   First MD Initiated Contact with Patient 05/31/14 1921     Chief Complaint  Patient presents with  . Vaginal Bleeding     (Consider location/radiation/quality/duration/timing/severity/associated sxs/prior Treatment) HPI  Tracy Waller is a 23 y.o. female G4P3 presenting for in for evaluation of first trimester. Patient states she was seen in the ED approximately a month ago and was told she was pregnant, has not followed up with OB/GYN. Last menstrual period was at the end of May in the beginning of June. Patient reports a mild left lower quadrant abdominal discomfort and vaginal bleeding starting this morning. States she is passing clots. Does not describe it as heavy bleeding is on her second pack of the day right now. Patient denies dysuria, urinary frequency, abnormal vaginal discharge, fever, chills, chest pain, shortness of breath.  Past Medical History  Diagnosis Date  . Hypertension   . No pertinent past medical history    Past Surgical History  Procedure Laterality Date  . Cesarean section     Family History  Problem Relation Age of Onset  . Asthma Father   . Hypertension Father   . Hypertension Mother   . Hypertension Sister    History  Substance Use Topics  . Smoking status: Never Smoker   . Smokeless tobacco: Never Used  . Alcohol Use: No     Comment: occasionally   OB History   Grav Para Term Preterm Abortions TAB SAB Ect Mult Living   Review of Systems  10 systems reviewed and found to be negative, except as noted in the HPI.   Allergies  Review of patient's allergies indicates no known allergies.  Home Medications   Prior to Admission medications   Medication Sig Start Date End Date Taking? Authorizing Provider  naproxen (NAPROSYN) 375 MG tablet Take 1 tablet (375 mg total) by mouth 2 (two) times daily. 09/13/13   Rolan Bucco, MD  nitrofurantoin,  macrocrystal-monohydrate, (MACROBID) 100 MG capsule Take 1 capsule (100 mg total) by mouth 2 (two) times daily. X 7 days 04/09/14   Carlisle Beers Molpus, MD   BP 144/75  Pulse 85  Temp(Src) 98.3 F (36.8 C) (Oral)  Resp 18  Wt 156 lb (70.761 kg)  SpO2 100%  LMP 02/23/2014 Physical Exam  Nursing note and vitals reviewed. Constitutional: She is oriented to person, place, and time. She appears well-developed and well-nourished. No distress.  HENT:  Head: Normocephalic and atraumatic.  Mouth/Throat: Oropharynx is clear and moist.  Eyes: Conjunctivae and EOM are normal. Pupils are equal, round, and reactive to light.  Neck: Normal range of motion.  Cardiovascular: Normal rate, regular rhythm and intact distal pulses.   Pulmonary/Chest: Effort normal and breath sounds normal. No stridor. No respiratory distress. She has no wheezes. She has no rales. She exhibits no tenderness.  Abdominal: Soft. Bowel sounds are normal. She exhibits no distension and no mass. There is no rebound and no guarding.  Musculoskeletal: Normal range of motion. She exhibits no edema.  Neurological: She is alert and oriented to person, place, and time.  Skin: Skin is warm.  Psychiatric: She has a normal mood and affect.    ED Course  Procedures (including critical care time) Labs Review Labs Reviewed  URINALYSIS, ROUTINE W REFLEX MICROSCOPIC - Abnormal; Notable for the following:    Hgb urine dipstick  SMALL (*)    Leukocytes, UA TRACE (*)    All other components within normal limits  CBC WITH DIFFERENTIAL - Abnormal; Notable for the following:    Hemoglobin 11.1 (*)    HCT 33.8 (*)    All other components within normal limits  BASIC METABOLIC PANEL - Abnormal; Notable for the following:    Potassium 3.6 (*)    All other components within normal limits  URINE MICROSCOPIC-ADD ON - Abnormal; Notable for the following:    Bacteria, UA MANY (*)    All other components within normal limits  URINE CULTURE  HCG,  QUANTITATIVE, PREGNANCY  HIV ANTIBODY (ROUTINE TESTING)  RPR  ABO/RH    Imaging Review US Ob Comp Less 14 Wks  05/31/2014   CLINICAL DATA:  Vaginal bleeding.  EXAM: OBSTETRIC <14 WK [place] AND TRANSVAGINAL OB US  TECHNIQUE: Both transabdominal and transvaginal ultrasound examinations were performed for complete evaluation of the gestation as well as the maternal uterus, adnexal regions, and pelvic cul-de-sac. Transvaginal technique was performed to assess early pregnancy.  COMPARISON:  None.  FINDINGS: Intrauterine gestational sac: Probable single tiny intrauterine gestational sac visualized  Yolk sac:  Not visualized  Embryo:  Not visualized  MSD:  6  mm   5 w   2  d  Maternal uterus/adnexae: Both ovaries are normal in appearance. No adnexal mass or free fluid identified.  IMPRESSION: Probable single early intrauterine gestational sac, but no yolk sac, fetal pole, or cardiac activity yet visualized. Recommend follow-up quantitative B-HCG levels and follow-up US in 14 days to confirm and assess viability. This recommendation follows SRU consensus guidelines: Diagnostic Criteria for Nonviable Pregnancy Early in the First Trimester. Malva Limes Med 2013; 914:7829-56.   Electronically Signed   By: Myles Rosenthal M.D.   On: 05/31/2014 19:58   US Ob Transvaginal  05/31/2014   CLINICAL DATA:  Vaginal bleeding.  EXAM: OBSTETRIC <14 WK [place] AND TRANSVAGINAL OB US  TECHNIQUE: Both transabdominal and transvaginal ultrasound examinations were performed for complete evaluation of the gestation as well as the maternal uterus, adnexal regions, and pelvic cul-de-sac. Transvaginal technique was performed to assess early pregnancy.  COMPARISON:  None.  FINDINGS: Intrauterine gestational sac: Probable single tiny intrauterine gestational sac visualized  Yolk sac:  Not visualized  Embryo:  Not visualized  MSD:  6  mm   5 w   2  d  Maternal uterus/adnexae: Both ovaries are normal in appearance. No adnexal mass or free fluid identified.   IMPRESSION: Probable single early intrauterine gestational sac, but no yolk sac, fetal pole, or cardiac activity yet visualized. Recommend follow-up quantitative B-HCG levels and follow-up US in 14 days to confirm and assess viability. This recommendation follows SRU consensus guidelines: Diagnostic Criteria for Nonviable Pregnancy Early in the First Trimester. Malva Limes Med 2013; 213:0865-78.   Electronically Signed   By: Myles Rosenthal M.D.   On: 05/31/2014 19:58     EKG Interpretation None      MDM   Final diagnoses:  Miscarriage    Filed Vitals:   05/31/14 1836  BP: 144/75  Pulse: 85  Temp: 98.3 F (36.8 C)  TempSrc: Oral  Resp: 18  Weight: 156 lb (70.761 kg)  SpO2: 100%    Tracy Waller is a 22 y.o. female presenting with abdominal pain and vaginal bleeding. Last menstrual period was at the end of May. She had a positive pregnancy test in the ED one month ago. She declines  pain medication. He was also UTI which was treated with Macrobid. Serial abdominal exams are benign. Quantitative hCG is 1. Ultrasound shows a single early gestational sac with no yolk sac fetal pole or cardiac activity. Discussed findings with the patient and advised her that this is likely a miscarriage. Patient declines pelvic exam at this time. She denies any dysuria, hematuria, foul smelling during, urinary frequency or concentrated urine. Will culture urine but refrain from treatment at this time. Patient has asked to leave the ED, I have advised her that it is important that she follows with OB/GYN in the next week. Have encouraged her to return to the emergency room for any new or worsening symptoms.  Evaluation does not show pathology that would require ongoing emergent intervention or inpatient treatment. Pt is hemodynamically stable and mentating appropriately. Discussed findings and plan with patient/guardian, who agrees with care plan. All questions answered. Return precautions discussed and outpatient  follow up given.      Wynetta Emery, PA-C 06/01/14 [phone]

## 2014-05-31 NOTE — ED Notes (Signed)
Pt vaginal bleeding started this morning. Reports no follow up with OB since finding out she was pregnant 1 month ago. Pt reports 2nd pad used now

## 2014-06-01 LAB — RPR

## 2014-06-01 LAB — HIV ANTIBODY (ROUTINE TESTING W REFLEX): HIV 1&2 Ab, 4th Generation: NONREACTIVE

## 2014-06-03 NOTE — ED Provider Notes (Signed)
Medical screening examination/treatment/procedure(s) were performed by non-physician practitioner and as supervising physician I was immediately available for consultation/collaboration.   EKG Interpretation None        Rocsi Hazelbaker T Shaylene Paganelli, MD 06/03/14 0712 

## 2014-06-04 LAB — URINE CULTURE

## 2014-06-04 NOTE — Progress Notes (Signed)
ED Antimicrobial Stewardship Positive Culture Follow Up   Tracy Waller is an 23 y.o. female who presented to [facility] on 05/31/2014 with a chief complaint of  Chief Complaint  Patient presents with  . Vaginal Bleeding    Recent Results (from the past 720 hour(s))  URINE CULTURE     Status: None   Collection Time    05/31/14  7:20 PM      Result Value Ref Range Status   Specimen Description URINE, CLEAN CATCH   Final   Special Requests NONE   Final   Culture  Setup Time     Final   Value: 06/01/2014 02:56     Performed at [facility] Count     Final   Value: 60,000 COLONIES/ML     Performed at [facility]   Culture     Final   Value: KLEBSIELLA PNEUMONIAE     Performed at [facility]   Report Status 06/04/2014 FINAL   Final   Organism ID, Bacteria KLEBSIELLA PNEUMONIAE   Final     Treated with , organism resistant to prescribed antimicrobial  Patient discharged originally without antimicrobial agent and treatment is now indicated  New antibiotic prescription: Kelfex  PO BID x 7 days  ED Provider: Arthor Captain, PA-C   [name] M 06/04/2014, 10:14 AM Infectious Diseases Pharmacist Phone# [phone]

## 2014-06-05 ENCOUNTER — Telehealth (HOSPITAL_BASED_OUTPATIENT_CLINIC_OR_DEPARTMENT_OTHER): Payer: Self-pay | Admitting: Emergency Medicine

## 2014-06-06 ENCOUNTER — Telehealth (HOSPITAL_COMMUNITY): Payer: Self-pay | Admitting: *Deleted

## 2014-06-06 NOTE — ED Notes (Signed)
Contacted by patient, advised (+)urine culture, Keflex  BID x 7 per Arthor Captain, PA; called to [facility], [address] [facility]

## 2014-07-27 ENCOUNTER — Encounter (HOSPITAL_BASED_OUTPATIENT_CLINIC_OR_DEPARTMENT_OTHER): Payer: Self-pay | Admitting: Emergency Medicine

## 2015-09-26 DIAGNOSIS — Z8619 Personal history of other infectious and parasitic diseases: Secondary | ICD-10-CM

## 2015-09-26 HISTORY — DX: Personal history of other infectious and parasitic diseases: Z86.19

## 2016-06-10 ENCOUNTER — Inpatient Hospital Stay (HOSPITAL_COMMUNITY)
Admission: AD | Admit: 2016-06-10 | Discharge: 2016-06-10 | Disposition: A | Discharge status: Home / Self Care | Payer: Medicaid Other | Source: Ambulatory Visit | Attending: Obstetrics and Gynecology | Admitting: Obstetrics and Gynecology

## 2016-06-10 ENCOUNTER — Encounter (HOSPITAL_COMMUNITY): Payer: Self-pay | Admitting: *Deleted

## 2016-06-10 DIAGNOSIS — L293 Anogenital pruritus, unspecified: Secondary | ICD-10-CM | POA: Insufficient documentation

## 2016-06-10 DIAGNOSIS — I10 Essential (primary) hypertension: Secondary | ICD-10-CM | POA: Insufficient documentation

## 2016-06-10 DIAGNOSIS — N898 Other specified noninflammatory disorders of vagina: Secondary | ICD-10-CM

## 2016-06-10 LAB — URINALYSIS, ROUTINE W REFLEX MICROSCOPIC
Bilirubin Urine: NEGATIVE
Glucose, UA: NEGATIVE mg/dL
KETONES UR: NEGATIVE mg/dL
Nitrite: NEGATIVE
Protein, ur: NEGATIVE mg/dL
SPECIFIC GRAVITY, URINE: 1.025 (ref 1.005–1.030)
pH: 6 (ref 5.0–8.0)

## 2016-06-10 LAB — POCT PREGNANCY, URINE: Preg Test, Ur: NEGATIVE

## 2016-06-10 LAB — WET PREP, GENITAL
Clue Cells Wet Prep HPF POC: NONE SEEN
SPERM: NONE SEEN
Trich, Wet Prep: NONE SEEN
Yeast Wet Prep HPF POC: NONE SEEN

## 2016-06-10 LAB — URINE MICROSCOPIC-ADD ON

## 2016-06-10 NOTE — Discharge Instructions (Signed)
We will have the results of the testing back in approximately 48 hours. If anything is abnormal we will call and let you know.  You can use traditional unscented unflavored petroleum jelly/Vaseline over the area if it is tender to touch or when anything labs on it.

## 2016-06-10 NOTE — MAU Provider Note (Signed)
History     CSN: [id number]  Arrival date and time: 06/10/16 [id number]   First Provider Initiated Contact with Patient 06/10/16 [phone]      Chief Complaint  Patient presents with  . Vaginal Itching  . vaginal blisters   Vaginal Itching  The patient's primary symptoms include genital itching, genital lesions and a genital rash. The patient's pertinent negatives include no genital odor, vaginal bleeding or vaginal discharge. This is a new problem. The current episode started in the past 7 days. The problem occurs constantly. The problem has been unchanged. The pain is moderate. The problem affects the right side. She is not pregnant. Associated symptoms include rash. Pertinent negatives include no abdominal pain, back pain, chills, constipation, diarrhea, dysuria, fever, frequency, headaches, nausea, painful intercourse, sore throat, urgency or vomiting. The symptoms are aggravated by activity and urinating. She has tried nothing for the symptoms. Sexual activity: pt reports it has been several weeks since last sex. No, her partner does not have an STD.       Past Medical History:  Diagnosis Date  . Hypertension   . No pertinent past medical history     Past Surgical History:  Procedure Laterality Date  . CESAREAN SECTION      Family History  Problem Relation Age of Onset  . Asthma Father   . Hypertension Father   . Hypertension Mother   . Hypertension Sister     Social History  Substance Use Topics  . Smoking status: Never Smoker  . Smokeless tobacco: Never Used  . Alcohol use No     Comment: occasionally    Allergies: No Known Allergies  Prescriptions Prior to Admission  Medication Sig Dispense Refill Last Dose  . naproxen (NAPROSYN) 375 MG tablet Take 1 tablet (375 mg total) by mouth 2 (two) times daily. 20 tablet 0   . nitrofurantoin, macrocrystal-monohydrate, (MACROBID) 100 MG capsule Take 1 capsule (100 mg total) by mouth 2 (two) times daily. X 7 days 14 capsule 0       Review of Systems  Constitutional: Negative for chills and fever.  HENT: Negative for sore throat.   Eyes: Negative for blurred vision and double vision.  Respiratory: Negative for cough and sputum production.   Cardiovascular: Negative for chest pain and palpitations.  Gastrointestinal: Negative for abdominal pain, constipation, diarrhea, nausea and vomiting.  Genitourinary: Negative for dysuria, frequency, urgency and vaginal discharge.  Musculoskeletal: Negative for back pain and myalgias.  Skin: Positive for rash.  Neurological: Negative for dizziness, tingling and headaches.   Physical Exam   Blood pressure 133/77, pulse 94, temperature 98.6 F (37 C), temperature source Oral, resp. rate 16, last menstrual period 06/10/2016, SpO2 98 %.  Physical Exam  Vitals reviewed. Constitutional: She is oriented to person, place, and time.  HENT:  Head: Normocephalic and atraumatic.  Cardiovascular: Normal rate and intact distal pulses.   Respiratory: Effort normal and breath sounds normal. No respiratory distress.  GI: Soft. Bowel sounds are normal. She exhibits no distension.  Genitourinary:       Genitourinary Comments: No vaginal discharge  Neurological: She is alert and oriented to person, place, and time.  Skin: Skin is warm and dry.    MAU Course  Procedures  MDM In the MAU patient underwent testing with urinalysis, wet prep, GC chlamydia and HSV culture. The lesion could be ruptured HSV vesicles but is not very typical finding. Appears to be more mechanical in nature.  Assessment and Plan  #1: Vaginal  burning and itching, small external lesion noted does not appear to be HSV. HSV culture was sent in addition to wet prep and GC chlamydia. Will await culture results for treatment as does not appear typical. This is more by mechanical damage or abrasion. Patient reports no recent trauma however. Wet prep is negative, urinalysis shows small blood and white blood cells.  Will send for culture. Await HSV and GC chlamydia.  Tracy Waller 06/10/2016, 7:25 AM

## 2016-06-10 NOTE — MAU Note (Signed)
Pt states she has vaginal blister, itching, and burning that started about one week ago.  Pt states this has never happened before.

## 2016-06-11 LAB — URINE CULTURE: Culture: NO GROWTH

## 2016-06-12 ENCOUNTER — Telehealth: Payer: Self-pay | Admitting: Obstetrics and Gynecology

## 2016-06-12 LAB — GC/CHLAMYDIA PROBE AMP (~~LOC~~) NOT AT ARMC
CHLAMYDIA, DNA PROBE: POSITIVE — AB
Neisseria Gonorrhea: NEGATIVE

## 2016-06-12 MED ORDER — AZITHROMYCIN 500 MG PO TABS: 1000.0000 mg | ORAL_TABLET | Freq: Every day | ORAL | 0 refills | Status: AC

## 2016-06-12 NOTE — Telephone Encounter (Signed)
Pt informed of positive chlamydia. Advised treatment of partner. Sent prescription to pharmacy.

## 2016-06-15 LAB — HSV CULTURE AND TYPING

## 2016-06-30 ENCOUNTER — Encounter (HOSPITAL_COMMUNITY): Payer: Self-pay | Admitting: *Deleted

## 2016-06-30 ENCOUNTER — Emergency Department (HOSPITAL_COMMUNITY)
Admission: EM | Admit: 2016-06-30 | Discharge: 2016-06-30 | Disposition: A | Discharge status: Home / Self Care | Payer: Medicaid Other | Attending: Emergency Medicine | Admitting: Emergency Medicine

## 2016-06-30 DIAGNOSIS — I10 Essential (primary) hypertension: Secondary | ICD-10-CM | POA: Insufficient documentation

## 2016-06-30 DIAGNOSIS — N898 Other specified noninflammatory disorders of vagina: Secondary | ICD-10-CM

## 2016-06-30 DIAGNOSIS — A630 Anogenital (venereal) warts: Secondary | ICD-10-CM | POA: Insufficient documentation

## 2016-06-30 LAB — URINALYSIS, ROUTINE W REFLEX MICROSCOPIC
Bilirubin Urine: NEGATIVE
GLUCOSE, UA: NEGATIVE mg/dL
Hgb urine dipstick: NEGATIVE
KETONES UR: NEGATIVE mg/dL
Nitrite: NEGATIVE
PH: 7 (ref 5.0–8.0)
Protein, ur: NEGATIVE mg/dL
Specific Gravity, Urine: 1.011 (ref 1.005–1.030)

## 2016-06-30 LAB — WET PREP, GENITAL
Sperm: NONE SEEN
Yeast Wet Prep HPF POC: NONE SEEN

## 2016-06-30 LAB — GC/CHLAMYDIA PROBE AMP (~~LOC~~) NOT AT ARMC
Chlamydia: POSITIVE — AB
Neisseria Gonorrhea: NEGATIVE

## 2016-06-30 LAB — URINE MICROSCOPIC-ADD ON

## 2016-06-30 LAB — POC URINE PREG, ED: Preg Test, Ur: NEGATIVE

## 2016-06-30 MED ORDER — CEFTRIAXONE SODIUM 250 MG IJ SOLR
250.0000 mg | Freq: Once | INTRAMUSCULAR | Status: AC
  Administered 2016-06-30: 250 mg via INTRAMUSCULAR
  Filled 2016-06-30: qty 250

## 2016-06-30 MED ORDER — METRONIDAZOLE 500 MG PO TABS: 500.0000 mg | ORAL_TABLET | Freq: Two times a day (BID) | ORAL | 0 refills | Status: DC

## 2016-06-30 MED ORDER — STERILE WATER FOR INJECTION IJ SOLN
0.9000 mL | Freq: Once | INTRAMUSCULAR | Status: AC
  Administered 2016-06-30: 0.9 mL via INTRAMUSCULAR
  Filled 2016-06-30: qty 10

## 2016-06-30 MED ORDER — AZITHROMYCIN 250 MG PO TABS
1000.0000 mg | ORAL_TABLET | Freq: Once | ORAL | Status: AC
  Administered 2016-06-30: 1000 mg via ORAL
  Filled 2016-06-30: qty 4

## 2016-06-30 MED ORDER — METRONIDAZOLE 500 MG PO TABS
500.0000 mg | ORAL_TABLET | Freq: Once | ORAL | Status: AC
  Administered 2016-06-30: 500 mg via ORAL
  Filled 2016-06-30: qty 1

## 2016-06-30 NOTE — ED Triage Notes (Signed)
Pt reports dysuria, vaginal discharge, foul odor, and bumps on her anus for two weeks.

## 2016-06-30 NOTE — Discharge Instructions (Signed)
As discussed, with limits findings is very important and her partner be evaluated for similar concerns.  Equally important is that you follow-up with our women's health experts, and or your gynecologist.  Return here for concerning changes in your condition.

## 2016-06-30 NOTE — ED Provider Notes (Signed)
[facility]-EMERGENCY DEPT Provider Note   CSN: [id number] Arrival date & time: 06/30/16  0101     History   Chief Complaint Chief Complaint  Patient presents with  . Vaginal Pain  . Vaginal Discharge    HPI Tracy Waller is a 25 y.o. female.  HPI  Patient presents with concern of vaginal discharge, and growths around her anal genital area.  Onset seems to have been some time ago, she notes increasing discomfort about the anal genital area given the persistency of growths throughout. No new vaginal bleeding, aside from a post intercourse. Last menstrual period 3 weeks ago. No new fever, chills, nausea, vomiting. Patient initially denies history of STD, but after the pelvic exam patient states that she was diagnosed with chlamydia 2 weeks ago, did not receive any treatment.     Past Medical History:  Diagnosis Date  . Hypertension   . No pertinent past medical history     Patient Active Problem List   Diagnosis Date Noted  . Anemia 05/27/2013  . H/O cesarean section 05/27/2013  . History of pregnancy induced hypertension 05/27/2013  . Candidiasis of vulva and vagina 02/25/2013  . GERD without esophagitis 02/20/2013    Past Surgical History:  Procedure Laterality Date  . CESAREAN SECTION      OB History    Gravida Para Term Preterm AB Living   3 3 3     3    SAB TAB Ectopic Multiple Live Births           1       Home Medications    Prior to Admission medications   Medication Sig Start Date End Date Taking? Authorizing Provider  naproxen (NAPROSYN) 375 MG tablet Take 1 tablet (375 mg total) by mouth 2 (two) times daily. Patient not taking: Reported on 06/30/2016 09/13/13   Rolan BuccoMelanie Belfi, MD    Family History Family History  Problem Relation Age of Onset  . Asthma Father   . Hypertension Father   . Hypertension Mother   . Hypertension Sister     Social History Social History  Substance Use Topics  . Smoking status: Never Smoker  . Smokeless  tobacco: Never Used  . Alcohol use No     Comment: occasionally     Allergies   Review of patient's allergies indicates no known allergies.   Review of Systems Review of Systems  Constitutional:       Per HPI, otherwise negative  HENT:       Per HPI, otherwise negative  Respiratory:       Per HPI, otherwise negative  Cardiovascular:       Per HPI, otherwise negative  Gastrointestinal: Negative for vomiting.  Endocrine:       Negative aside from HPI  Genitourinary:       Neg aside from HPI   Musculoskeletal:       Per HPI, otherwise negative  Skin: Negative.   Allergic/Immunologic: Negative for immunocompromised state.  Neurological: Negative for syncope.     Physical Exam Updated Vital Signs BP 116/64   Pulse 71   Temp 98.1 F (36.7 C) (Oral)   Resp 16   LMP 06/10/2016   SpO2 100%   Physical Exam  Constitutional: She is oriented to person, place, and time. She appears well-developed and well-nourished. No distress.  HENT:  Head: Normocephalic and atraumatic.  Eyes: Conjunctivae and EOM are normal.  Cardiovascular: Normal rate and regular rhythm.   Pulmonary/Chest: Effort normal and breath  sounds normal. No stridor. No respiratory distress.  Abdominal: She exhibits no distension. There is no tenderness. There is no guarding.  Genitourinary:    Pelvic exam was performed with patient supine. There is lesion on the right labia. There is lesion on the left labia. Cervix exhibits discharge. Cervix exhibits no motion tenderness. Right adnexum displays no mass and no tenderness. Left adnexum displays no mass and no tenderness. No tenderness or bleeding in the vagina. Vaginal discharge found.  Musculoskeletal: She exhibits no edema.  Neurological: She is alert and oriented to person, place, and time. No cranial nerve deficit.  Skin: Skin is warm and dry.  Psychiatric: She has a normal mood and affect.  Nursing note and vitals reviewed.    ED Treatments / Results    Labs (all labs ordered are listed, but only abnormal results are displayed) Labs Reviewed  URINALYSIS, ROUTINE W REFLEX MICROSCOPIC (NOT AT [facility]) - Abnormal; Notable for the following:       Result Value   Leukocytes, UA SMALL (*)    All other components within normal limits  URINE MICROSCOPIC-ADD ON - Abnormal; Notable for the following:    Squamous Epithelial / LPF 0-5 (*)    Bacteria, UA RARE (*)    All other components within normal limits  WET PREP, GENITAL  POC URINE PREG, ED  GC/CHLAMYDIA PROBE AMP ([place]) NOT AT [facility]     Procedures Pelvic exam Date/Time: 06/30/2016 4:32 AM Performed by: Gerhard Munch Authorized by: Gerhard Munch  Consent: Verbal consent obtained. Risks and benefits: risks, benefits and alternatives were discussed Consent given by: patient Patient understanding: patient states understanding of the procedure being performed Patient consent: the patient's understanding of the procedure matches consent given Procedure consent: procedure consent matches procedure scheduled Relevant documents: relevant documents present and verified Test results: test results available and properly labeled Site marked: the operative site was marked Imaging studies: imaging studies available Required items: required blood products, implants, devices, and special equipment available Patient identity confirmed: verbally with patient Time out: Immediately prior to procedure a "time out" was called to verify the correct patient, procedure, equipment, support staff and site/side marked as required. Preparation: Patient was prepped and draped in the usual sterile fashion. Local anesthesia used: no  Anesthesia: Local anesthesia used: no  Sedation: Patient sedated: no Patient tolerance: Patient tolerated the procedure well with no immediate complications    (including critical care time)  Medications Ordered in ED Medications  cefTRIAXone (ROCEPHIN) injection  250 mg (not administered)  azithromycin (ZITHROMAX) tablet 1,000 mg (not administered)     Initial Impression / Assessment and Plan / ED Course  I have reviewed the triage vital signs and the nursing notes.  Pertinent labs & imaging results that were available during my care of the patient were reviewed by me and considered in my medical decision making (see chart for details).  Clinical Course  And female presents with concern of vaginal discharge, vaginal growths. On exam the patient is awake and alert, with soft, non-peritoneal abdomen, no adnexal tenderness, low suspicion for PID, TOA. However, the patient is then have discharge, and eventually she notes that she has recently been diagnosed with chlamydia. Patient received therapy for both chlamydia and gonorrhea. Patient provided resources to obtain follow-up for further evaluation of her lesions that are concerning for condyloma.   Gerhard Munch, MD 06/30/16 [phone]

## 2016-07-03 ENCOUNTER — Telehealth (HOSPITAL_BASED_OUTPATIENT_CLINIC_OR_DEPARTMENT_OTHER): Payer: Self-pay | Admitting: Emergency Medicine

## 2016-08-08 ENCOUNTER — Telehealth (HOSPITAL_BASED_OUTPATIENT_CLINIC_OR_DEPARTMENT_OTHER): Payer: Self-pay | Admitting: Emergency Medicine

## 2016-08-08 NOTE — Telephone Encounter (Signed)
No response to letter, LOST TO FOLLOWUP 

## 2016-10-04 ENCOUNTER — Encounter (HOSPITAL_COMMUNITY): Payer: Self-pay | Admitting: *Deleted

## 2016-10-04 ENCOUNTER — Inpatient Hospital Stay (HOSPITAL_COMMUNITY)
Admission: AD | Admit: 2016-10-04 | Discharge: 2016-10-04 | Disposition: A | Discharge status: Home / Self Care | Payer: Medicaid Other | Source: Ambulatory Visit | Attending: Obstetrics and Gynecology | Admitting: Obstetrics and Gynecology

## 2016-10-04 DIAGNOSIS — Z8249 Family history of ischemic heart disease and other diseases of the circulatory system: Secondary | ICD-10-CM | POA: Insufficient documentation

## 2016-10-04 DIAGNOSIS — N3000 Acute cystitis without hematuria: Secondary | ICD-10-CM | POA: Diagnosis not present

## 2016-10-04 DIAGNOSIS — Z825 Family history of asthma and other chronic lower respiratory diseases: Secondary | ICD-10-CM | POA: Insufficient documentation

## 2016-10-04 DIAGNOSIS — B9689 Other specified bacterial agents as the cause of diseases classified elsewhere: Secondary | ICD-10-CM | POA: Diagnosis not present

## 2016-10-04 DIAGNOSIS — Z3202 Encounter for pregnancy test, result negative: Secondary | ICD-10-CM | POA: Diagnosis not present

## 2016-10-04 DIAGNOSIS — Z8619 Personal history of other infectious and parasitic diseases: Secondary | ICD-10-CM | POA: Diagnosis not present

## 2016-10-04 DIAGNOSIS — I10 Essential (primary) hypertension: Secondary | ICD-10-CM | POA: Insufficient documentation

## 2016-10-04 DIAGNOSIS — N760 Acute vaginitis: Secondary | ICD-10-CM | POA: Insufficient documentation

## 2016-10-04 HISTORY — DX: Personal history of other infectious and parasitic diseases: Z86.19

## 2016-10-04 LAB — POCT PREGNANCY, URINE: PREG TEST UR: NEGATIVE

## 2016-10-04 LAB — WET PREP, GENITAL
Sperm: NONE SEEN
Trich, Wet Prep: NONE SEEN
Yeast Wet Prep HPF POC: NONE SEEN

## 2016-10-04 LAB — URINALYSIS, ROUTINE W REFLEX MICROSCOPIC
Bilirubin Urine: NEGATIVE
GLUCOSE, UA: NEGATIVE mg/dL
KETONES UR: NEGATIVE mg/dL
NITRITE: NEGATIVE
PROTEIN: 100 mg/dL — AB
Specific Gravity, Urine: 1.02 (ref 1.005–1.030)
pH: 6 (ref 5.0–8.0)

## 2016-10-04 MED ORDER — METRONIDAZOLE 0.75 % VA GEL: 1.0000 | Freq: Two times a day (BID) | VAGINAL | 0 refills | Status: DC

## 2016-10-04 MED ORDER — SULFAMETHOXAZOLE-TRIMETHOPRIM 800-160 MG PO TABS: 1.0000 | ORAL_TABLET | Freq: Two times a day (BID) | ORAL | 0 refills | Status: DC

## 2016-10-04 NOTE — MAU Note (Signed)
Pt thinks she has a UTI, has noted blood with wiping, burning with urination.  Also lower back pain for the last 2 days.  Denies vomiting, diarrhea or fever.

## 2016-10-04 NOTE — Discharge Instructions (Signed)

## 2016-10-04 NOTE — MAU Provider Note (Signed)
History     CSN: [id number]  Arrival date and time: 10/04/16 1134   First Provider Initiated Contact with Patient 10/04/16 1220      Chief Complaint  Patient presents with  . Dysuria   Tracy Waller is a 26 y.o. [id number] female who presents for dysuria. Symptoms began 2 days ago. Also reports some vaginal spotting for the last 2 days; states she is sure the bleeding is vaginal & not urinary. Recent history of chlamydia & trichomonas that both her and partner were treated for. Is with same partner & requesting retesting today.    Dysuria   This is a new problem. Episode onset: x 2 days. The problem occurs every urination. The problem has been gradually worsening. The quality of the pain is described as burning. The pain is at a severity of 8/10. There has been no fever. She is sexually active. There is no history of pyelonephritis. Associated symptoms include a discharge, flank pain, hesitancy and urgency. Pertinent negatives include no chills, frequency, hematuria, possible pregnancy or vomiting. She has tried nothing for the symptoms. Her past medical history is significant for recurrent UTIs (reports at least 1 UTI per year). There is no history of catheterization, kidney stones or a single kidney.   OB History    Gravida Para Term Preterm AB Living   3 3 3     3    SAB TAB Ectopic Multiple Live Births           1      Past Medical History:  Diagnosis Date  . Hx of chlamydia infection 2017  . Hx of trichomoniasis 2017  . Hypertension     Past Surgical History:  Procedure Laterality Date  . CESAREAN SECTION      Family History  Problem Relation Age of Onset  . Asthma Father   . Hypertension Father   . Hypertension Mother   . Hypertension Sister     Social History  Substance Use Topics  . Smoking status: Never Smoker  . Smokeless tobacco: Never Used  . Alcohol use No     Comment: occasionally    Allergies: No Known Allergies  Prescriptions Prior to Admission   Medication Sig Dispense Refill Last Dose  . metroNIDAZOLE (FLAGYL) 500 MG tablet Take 1 tablet (500 mg total) by mouth 2 (two) times daily. 14 tablet 0   . naproxen (NAPROSYN) 375 MG tablet Take 1 tablet (375 mg total) by mouth 2 (two) times daily. (Patient not taking: Reported on 06/30/2016) 20 tablet 0 Completed Course at Unknown time    Review of Systems  Constitutional: Negative for chills and fever.  Gastrointestinal: Negative.  Negative for vomiting.  Genitourinary: Positive for decreased urine volume, difficulty urinating, dysuria, flank pain, hesitancy, urgency, vaginal bleeding and vaginal discharge. Negative for dyspareunia, frequency, genital sores, hematuria and vaginal pain.   Physical Exam   Blood pressure 106/70, pulse 103, temperature 98.1 F (36.7 C), temperature source Oral, resp. rate 16, height 5\' 3"  (1.6 m), weight 166 lb (75.3 kg), last menstrual period 09/22/2016.  Physical Exam  Nursing note and vitals reviewed. Constitutional: She is oriented to person, place, and time. She appears well-developed and well-nourished. No distress.  HENT:  Head: Normocephalic and atraumatic.  Eyes: Conjunctivae are normal. Right eye exhibits no discharge. Left eye exhibits no discharge. No scleral icterus.  Neck: Normal range of motion.  Cardiovascular: Normal rate, regular rhythm and normal heart sounds.   No murmur heard. Respiratory: Effort normal  and breath sounds normal. No respiratory distress. She has no wheezes.  GI: Soft. Bowel sounds are normal. She exhibits no distension. There is CVA tenderness (right side). There is no rebound and no guarding.  Genitourinary: Uterus normal. Cervix exhibits no motion tenderness and no friability. There is bleeding (small amount of dark red blood coming from os) in the vagina. Vaginal discharge found.  Neurological: She is alert and oriented to person, place, and time.  Skin: Skin is warm and dry. She is not diaphoretic.  Psychiatric:  She has a normal mood and affect. Her behavior is normal. Judgment and thought content normal.    MAU Course  Procedures Results for orders placed or performed during the hospital encounter of 10/04/16 (from the past 24 hour(s))  Urinalysis, Routine w reflex microscopic ([facility])     Status: Abnormal   Collection Time: 10/04/16 11:48 AM  Result Value Ref Range   Color, Urine YELLOW YELLOW   APPearance HAZY (A) CLEAR   Specific Gravity, Urine 1.020 1.005 - 1.030   pH 6.0 5.0 - 8.0   Glucose, UA NEGATIVE NEGATIVE mg/dL   Hgb urine dipstick MODERATE (A) NEGATIVE   Bilirubin Urine NEGATIVE NEGATIVE   Ketones, ur NEGATIVE NEGATIVE mg/dL   Protein, ur 829100 (A) NEGATIVE mg/dL   Nitrite NEGATIVE NEGATIVE   Leukocytes, UA MODERATE (A) NEGATIVE   RBC / HPF TOO NUMEROUS TO COUNT 0 - 5 RBC/hpf   WBC, UA TOO NUMEROUS TO COUNT 0 - 5 WBC/hpf   Bacteria, UA FEW (A) NONE SEEN   Squamous Epithelial / LPF 0-5 (A) NONE SEEN   Mucous PRESENT   Pregnancy, urine POC     Status: None   Collection Time: 10/04/16 12:13 PM  Result Value Ref Range   Preg Test, Ur NEGATIVE NEGATIVE  Wet prep, genital     Status: Abnormal   Collection Time: 10/04/16 12:33 PM  Result Value Ref Range   Yeast Wet Prep HPF POC NONE SEEN NONE SEEN   Trich, Wet Prep NONE SEEN NONE SEEN   Clue Cells Wet Prep HPF POC PRESENT (A) NONE SEEN   WBC, Wet Prep HPF POC MANY (A) NONE SEEN   Sperm NONE SEEN     MDM UPT negative GC/CT & wet prep U/a suspicious for UTI -- will tx & send for culture  Assessment and Plan  A: 1. Acute cystitis without hematuria   2. BV (bacterial vaginosis)    P: Discharge home Rx metrogel & septra Urine culture & GC/CT pending Discussed reasons to seek additional tx for worsening symptoms r/t UTI  Judeth Hornrin Edmund Rick 10/04/2016, 12:19 PM

## 2016-10-05 LAB — GC/CHLAMYDIA PROBE AMP (~~LOC~~) NOT AT ARMC
Chlamydia: POSITIVE — AB
NEISSERIA GONORRHEA: NEGATIVE

## 2016-10-06 ENCOUNTER — Telehealth: Payer: Self-pay | Admitting: Medical

## 2016-10-06 ENCOUNTER — Telehealth (HOSPITAL_COMMUNITY): Payer: Self-pay | Admitting: *Deleted

## 2016-10-06 ENCOUNTER — Other Ambulatory Visit: Payer: Self-pay | Admitting: Medical

## 2016-10-06 DIAGNOSIS — A749 Chlamydial infection, unspecified: Secondary | ICD-10-CM

## 2016-10-06 LAB — URINE CULTURE

## 2016-10-06 MED ORDER — AZITHROMYCIN 250 MG PO TABS: 1000.0000 mg | ORAL_TABLET | Freq: Once | ORAL | 0 refills | Status: AC

## 2016-10-06 NOTE — Telephone Encounter (Signed)
Called patient and informed her of +Chlamydia and need for treatment. Rx for Zithromax sent to patient's pharmacy. Partner treatment advised. Patient voiced understanding.   Marny LowensteinJulie N Darrol Brandenburg, PA-C 10/06/2016 3:38 PM

## 2017-02-05 ENCOUNTER — Emergency Department (HOSPITAL_BASED_OUTPATIENT_CLINIC_OR_DEPARTMENT_OTHER)
Admission: EM | Admit: 2017-02-05 | Discharge: 2017-02-05 | Disposition: A | Discharge status: Home / Self Care | Payer: Medicaid Other | Attending: Emergency Medicine | Admitting: Emergency Medicine

## 2017-02-05 ENCOUNTER — Encounter (HOSPITAL_BASED_OUTPATIENT_CLINIC_OR_DEPARTMENT_OTHER): Payer: Self-pay | Admitting: *Deleted

## 2017-02-05 DIAGNOSIS — A599 Trichomoniasis, unspecified: Secondary | ICD-10-CM

## 2017-02-05 DIAGNOSIS — N898 Other specified noninflammatory disorders of vagina: Secondary | ICD-10-CM

## 2017-02-05 DIAGNOSIS — Z202 Contact with and (suspected) exposure to infections with a predominantly sexual mode of transmission: Secondary | ICD-10-CM | POA: Diagnosis present

## 2017-02-05 DIAGNOSIS — A5900 Urogenital trichomoniasis, unspecified: Secondary | ICD-10-CM | POA: Insufficient documentation

## 2017-02-05 DIAGNOSIS — N760 Acute vaginitis: Secondary | ICD-10-CM | POA: Diagnosis not present

## 2017-02-05 DIAGNOSIS — Z8619 Personal history of other infectious and parasitic diseases: Secondary | ICD-10-CM | POA: Insufficient documentation

## 2017-02-05 DIAGNOSIS — I10 Essential (primary) hypertension: Secondary | ICD-10-CM | POA: Insufficient documentation

## 2017-02-05 DIAGNOSIS — B9689 Other specified bacterial agents as the cause of diseases classified elsewhere: Secondary | ICD-10-CM

## 2017-02-05 DIAGNOSIS — R590 Localized enlarged lymph nodes: Secondary | ICD-10-CM

## 2017-02-05 LAB — URINALYSIS, ROUTINE W REFLEX MICROSCOPIC
BILIRUBIN URINE: NEGATIVE
Glucose, UA: NEGATIVE mg/dL
HGB URINE DIPSTICK: NEGATIVE
KETONES UR: NEGATIVE mg/dL
NITRITE: NEGATIVE
Protein, ur: NEGATIVE mg/dL
SPECIFIC GRAVITY, URINE: 1.023 (ref 1.005–1.030)
pH: 7 (ref 5.0–8.0)

## 2017-02-05 LAB — URINALYSIS, MICROSCOPIC (REFLEX)

## 2017-02-05 LAB — WET PREP, GENITAL
SPERM: NONE SEEN
YEAST WET PREP: NONE SEEN

## 2017-02-05 LAB — PREGNANCY, URINE: Preg Test, Ur: NEGATIVE

## 2017-02-05 MED ORDER — METRONIDAZOLE 500 MG PO TABS: 500.0000 mg | ORAL_TABLET | Freq: Two times a day (BID) | ORAL | 0 refills | Status: DC

## 2017-02-05 MED ORDER — CEFTRIAXONE SODIUM 250 MG IJ SOLR
250.0000 mg | Freq: Once | INTRAMUSCULAR | Status: AC
  Administered 2017-02-05: 250 mg via INTRAMUSCULAR
  Filled 2017-02-05: qty 250

## 2017-02-05 MED ORDER — AZITHROMYCIN 250 MG PO TABS
1000.0000 mg | ORAL_TABLET | Freq: Once | ORAL | Status: AC
  Administered 2017-02-05: 1000 mg via ORAL
  Filled 2017-02-05: qty 4

## 2017-02-05 MED ORDER — LIDOCAINE HCL (PF) 1 % IJ SOLN
INTRAMUSCULAR | Status: AC
  Administered 2017-02-05: 0.9 mL
  Filled 2017-02-05: qty 5

## 2017-02-05 MED ORDER — DOXYCYCLINE HYCLATE 100 MG PO CAPS: 100.0000 mg | ORAL_CAPSULE | Freq: Two times a day (BID) | ORAL | 0 refills | Status: DC

## 2017-02-05 MED ORDER — ACYCLOVIR 400 MG PO TABS: 400.0000 mg | ORAL_TABLET | Freq: Three times a day (TID) | ORAL | 0 refills | Status: AC

## 2017-02-05 NOTE — ED Notes (Signed)
Given cheese, crackers and soda.

## 2017-02-05 NOTE — Discharge Instructions (Signed)
Follow up with GYN for a pap smear.  PCP for mouth lesions. Return for fever, worsening symptoms

## 2017-02-05 NOTE — ED Notes (Signed)
ED Provider at bedside to explain results. 

## 2017-02-05 NOTE — ED Notes (Signed)
ED Provider at bedside. 

## 2017-02-05 NOTE — ED Triage Notes (Signed)
Pt states she noticed blisters in her mouth, lumps to right groin and burning in vaginal area x 2 weeks. Unsure if pregnant. White vaginal d/c with odor.

## 2017-02-05 NOTE — ED Provider Notes (Signed)
[facility] Provider Note   CSN: [id number] Arrival date & time: 02/05/17  0058     History   Chief Complaint Chief Complaint  Patient presents with  . Exposure to STD    HPI Tracy Waller is a 26 y.o. female.  26 yo F with a chief complaint of pelvic pain right inguinal nodule mouth sores and pelvic discharge. Patient was recently diagnosed with chlamydia. She finished her treatment but had been having intercourse during the therapy. Thinks that her symptoms have recurred. Denies fevers denies chills. Having some mild headache. Denies abdominal tenderness. Denies dysuria. Unsure of her last menstrual cycle. Not seen GYN in at least 3 years.   The history is provided by the patient.  Exposure to STD  Pertinent negatives include no chest pain, no headaches and no shortness of breath.  Illness  This is a recurrent problem. The current episode started 2 days ago. The problem occurs constantly. The problem has not changed since onset.Pertinent negatives include no chest pain, no headaches and no shortness of breath. Nothing aggravates the symptoms. Nothing relieves the symptoms. She has tried nothing for the symptoms. The treatment provided no relief.    Past Medical History:  Diagnosis Date  . Hx of chlamydia infection 2017  . Hx of trichomoniasis 2017  . Hypertension     Patient Active Problem List   Diagnosis Date Noted  . Anemia 05/27/2013  . H/O cesarean section 05/27/2013  . History of pregnancy induced hypertension 05/27/2013  . Candidiasis of vulva and vagina 02/25/2013  . GERD without esophagitis 02/20/2013    Past Surgical History:  Procedure Laterality Date  . CESAREAN SECTION      OB History    Gravida Para Term Preterm AB Living   3 3 3     3    SAB TAB Ectopic Multiple Live Births           1       Home Medications    Prior to Admission medications   Medication Sig Start Date End Date Taking? Authorizing Provider  acyclovir  (ZOVIRAX) 400 MG tablet Take 1 tablet (400 mg total) by mouth 3 (three) times daily. 02/05/17 02/12/17  Melene Plan, DO  doxycycline (VIBRAMYCIN) 100 MG capsule Take 1 capsule (100 mg total) by mouth 2 (two) times daily. One po bid x 7 days 02/05/17   Melene Plan, DO  metroNIDAZOLE (FLAGYL) 500 MG tablet Take 1 tablet (500 mg total) by mouth 2 (two) times daily. One po bid x 7 days 02/05/17   Melene Plan, DO  metroNIDAZOLE (METROGEL VAGINAL) 0.75 % vaginal gel Place 1 Applicatorful vaginally 2 (two) times daily. 10/04/16   Judeth Horn, NP  sulfamethoxazole-trimethoprim (BACTRIM DS,SEPTRA DS) 800-160 MG tablet Take 1 tablet by mouth 2 (two) times daily. 10/04/16   Judeth Horn, NP    Family History Family History  Problem Relation Age of Onset  . Asthma Father   . Hypertension Father   . Hypertension Mother   . Hypertension Sister     Social History Social History  Substance Use Topics  . Smoking status: Never Smoker  . Smokeless tobacco: Never Used  . Alcohol use No     Comment: occasionally     Allergies   Patient has no known allergies.   Review of Systems Review of Systems  Constitutional: Negative for chills and fever.  HENT: Positive for mouth sores. Negative for congestion and rhinorrhea.   Eyes: Negative for redness and  visual disturbance.  Respiratory: Negative for shortness of breath and wheezing.   Cardiovascular: Negative for chest pain and palpitations.  Gastrointestinal: Negative for nausea and vomiting.  Genitourinary: Negative for dysuria and urgency.  Musculoskeletal: Negative for arthralgias and myalgias.  Skin: Negative for pallor and wound.  Neurological: Negative for dizziness and headaches.     Physical Exam Updated Vital Signs BP 121/75 (BP Location: Left Arm)   Pulse 88   Temp 98.3 F (36.8 C) (Oral)   Resp 18   Ht 5\' 3"  (1.6 m)   Wt 166 lb (75.3 kg)   LMP  (LMP Unknown)   SpO2 100%   BMI 29.41 kg/m   Physical Exam  Constitutional: She is  oriented to person, place, and time. She appears well-developed and well-nourished. No distress.  HENT:  Head: Normocephalic and atraumatic.  Mouth/Throat:    Eyes: EOM are normal. Pupils are equal, round, and reactive to light.  Neck: Normal range of motion. Neck supple.  Cardiovascular: Normal rate and regular rhythm.  Exam reveals no gallop and no friction rub.   No murmur heard. Pulmonary/Chest: Effort normal. She has no wheezes. She has no rales.  Abdominal: Soft. She exhibits no distension and no mass. There is no tenderness. There is no guarding.  Genitourinary: Uterus is not enlarged and not tender. Cervix exhibits discharge (brownish). Cervix exhibits no motion tenderness. Right adnexum displays tenderness. Right adnexum displays no fullness. Left adnexum displays no tenderness and no fullness.  Musculoskeletal: She exhibits no edema or tenderness.  Lymphadenopathy:       Right: Inguinal adenopathy present.  Neurological: She is alert and oriented to person, place, and time.  Skin: Skin is warm and dry. She is not diaphoretic.  Psychiatric: She has a normal mood and affect. Her behavior is normal.  Nursing note and vitals reviewed.    ED Treatments / Results  Labs (all labs ordered are listed, but only abnormal results are displayed) Labs Reviewed  WET PREP, GENITAL - Abnormal; Notable for the following:       Result Value   Trich, Wet Prep PRESENT (*)    Clue Cells Wet Prep HPF POC PRESENT (*)    WBC, Wet Prep HPF POC MANY (*)    All other components within normal limits  URINALYSIS, ROUTINE W REFLEX MICROSCOPIC - Abnormal; Notable for the following:    APPearance CLOUDY (*)    Leukocytes, UA SMALL (*)    All other components within normal limits  URINALYSIS, MICROSCOPIC (REFLEX) - Abnormal; Notable for the following:    Bacteria, UA FEW (*)    Squamous Epithelial / LPF 6-30 (*)    All other components within normal limits  PREGNANCY, URINE  RPR  HIV ANTIBODY  (ROUTINE TESTING)  GC/CHLAMYDIA PROBE AMP ([place]) NOT AT [facility]    EKG  EKG Interpretation None       Radiology No results found.  Procedures Procedures (including critical care time)  Medications Ordered in ED Medications  cefTRIAXone (ROCEPHIN) injection 250 mg (250 mg Intramuscular Given 02/05/17 0143)  azithromycin (ZITHROMAX) tablet 1,000 mg (1,000 mg Oral Given 02/05/17 0142)  lidocaine (PF) (XYLOCAINE) 1 % injection (0.9 mLs  Given 02/05/17 0146)     Initial Impression / Assessment and Plan / ED Course  I have reviewed the triage vital signs and the nursing notes.  Pertinent labs & imaging results that were available during my care of the patient were reviewed by me and considered in my medical decision  making (see chart for details).     26 yo F with a chief complaint of pelvic pain. She thinks he is re-contracted an STD. Will treat presumptively. Discussed need for GYN follow-up for Pap smear. With right adnexal lymphadenopathy will treat for PID. Ulcerative lesion in the mouth similar to partners, will give acyclovir to cover for HSV.  Also found to have trich and BV.  Able to cover partner since he was checked in as well.    1:53 AM:  I have discussed the diagnosis/risks/treatment options with the patient and believe the pt to be eligible for discharge home to follow-up with PCP/GYN. We also discussed returning to the ED immediately if new or worsening sx occur. We discussed the sx which are most concerning (e.g., sudden worsening pain, fever, inability to tolerate by mouth) that necessitate immediate return. Medications administered to the patient during their visit and any new prescriptions provided to the patient are listed below.  Medications given during this visit Medications  cefTRIAXone (ROCEPHIN) injection 250 mg (250 mg Intramuscular Given 02/05/17 0143)  azithromycin (ZITHROMAX) tablet 1,000 mg (1,000 mg Oral Given 02/05/17 0142)  lidocaine (PF)  (XYLOCAINE) 1 % injection (0.9 mLs  Given 02/05/17 0146)     The patient appears reasonably screen and/or stabilized for discharge and I doubt any other medical condition or other [facility] requiring further screening, evaluation, or treatment in the ED at this time prior to discharge.    Final Clinical Impressions(s) / ED Diagnoses   Final diagnoses:  Trichomonas infection  Vaginal discharge  Bacterial vaginosis  Inguinal adenopathy    New Prescriptions New Prescriptions   ACYCLOVIR (ZOVIRAX) 400 MG TABLET    Take 1 tablet (400 mg total) by mouth 3 (three) times daily.   DOXYCYCLINE (VIBRAMYCIN) 100 MG CAPSULE    Take 1 capsule (100 mg total) by mouth 2 (two) times daily. One po bid x 7 days   METRONIDAZOLE (FLAGYL) 500 MG TABLET    Take 1 tablet (500 mg total) by mouth 2 (two) times daily. One po bid x 7 days     Melene PlanFloyd, Jadarian Mckay, DO 02/05/17 [id number]

## 2017-02-05 NOTE — ED Notes (Signed)
Pt given d/c instructions as per chart. Rx x 3. Verbalizes understanding. No questions. 

## 2017-02-05 NOTE — ED Notes (Signed)
Pt triaged by this RN. Hx chlamydia.

## 2017-02-06 LAB — HIV ANTIBODY (ROUTINE TESTING W REFLEX): HIV Screen 4th Generation wRfx: NONREACTIVE

## 2017-02-06 LAB — GC/CHLAMYDIA PROBE AMP (~~LOC~~) NOT AT ARMC
Chlamydia: NEGATIVE
Neisseria Gonorrhea: NEGATIVE

## 2017-02-07 LAB — RPR, QUANT+TP ABS (REFLEX)
Rapid Plasma Reagin, Quant: 1:32 {titer} — ABNORMAL HIGH
T Pallidum Abs: POSITIVE — AB

## 2017-02-07 LAB — RPR: RPR: REACTIVE — AB

## 2017-02-13 ENCOUNTER — Inpatient Hospital Stay (HOSPITAL_BASED_OUTPATIENT_CLINIC_OR_DEPARTMENT_OTHER)
Admission: EM | Admit: 2017-02-13 | Discharge: 2017-02-15 | DRG: 869 | Disposition: A | Discharge status: Home / Self Care | Payer: Medicaid Other | Attending: Internal Medicine | Admitting: Internal Medicine

## 2017-02-13 ENCOUNTER — Emergency Department (HOSPITAL_BASED_OUTPATIENT_CLINIC_OR_DEPARTMENT_OTHER): Payer: Medicaid Other

## 2017-02-13 ENCOUNTER — Encounter (HOSPITAL_BASED_OUTPATIENT_CLINIC_OR_DEPARTMENT_OTHER): Payer: Self-pay | Admitting: Emergency Medicine

## 2017-02-13 DIAGNOSIS — R51 Headache: Secondary | ICD-10-CM | POA: Diagnosis present

## 2017-02-13 DIAGNOSIS — E876 Hypokalemia: Secondary | ICD-10-CM | POA: Diagnosis present

## 2017-02-13 DIAGNOSIS — Z202 Contact with and (suspected) exposure to infections with a predominantly sexual mode of transmission: Secondary | ICD-10-CM | POA: Diagnosis not present

## 2017-02-13 DIAGNOSIS — A529 Late syphilis, unspecified: Secondary | ICD-10-CM | POA: Diagnosis present

## 2017-02-13 DIAGNOSIS — A5901 Trichomonal vulvovaginitis: Secondary | ICD-10-CM

## 2017-02-13 DIAGNOSIS — R519 Headache, unspecified: Secondary | ICD-10-CM

## 2017-02-13 DIAGNOSIS — Z8249 Family history of ischemic heart disease and other diseases of the circulatory system: Secondary | ICD-10-CM | POA: Diagnosis not present

## 2017-02-13 DIAGNOSIS — R21 Rash and other nonspecific skin eruption: Secondary | ICD-10-CM | POA: Diagnosis present

## 2017-02-13 DIAGNOSIS — A523 Neurosyphilis, unspecified: Secondary | ICD-10-CM

## 2017-02-13 DIAGNOSIS — A539 Syphilis, unspecified: Secondary | ICD-10-CM | POA: Diagnosis present

## 2017-02-13 DIAGNOSIS — D649 Anemia, unspecified: Secondary | ICD-10-CM | POA: Diagnosis present

## 2017-02-13 DIAGNOSIS — G932 Benign intracranial hypertension: Secondary | ICD-10-CM

## 2017-02-13 DIAGNOSIS — A749 Chlamydial infection, unspecified: Secondary | ICD-10-CM | POA: Diagnosis present

## 2017-02-13 DIAGNOSIS — Z8619 Personal history of other infectious and parasitic diseases: Secondary | ICD-10-CM | POA: Diagnosis not present

## 2017-02-13 DIAGNOSIS — Z717 Human immunodeficiency virus [HIV] counseling: Secondary | ICD-10-CM

## 2017-02-13 DIAGNOSIS — F17210 Nicotine dependence, cigarettes, uncomplicated: Secondary | ICD-10-CM | POA: Diagnosis present

## 2017-02-13 DIAGNOSIS — Z7251 High risk heterosexual behavior: Secondary | ICD-10-CM | POA: Diagnosis not present

## 2017-02-13 DIAGNOSIS — Z825 Family history of asthma and other chronic lower respiratory diseases: Secondary | ICD-10-CM | POA: Diagnosis not present

## 2017-02-13 DIAGNOSIS — A599 Trichomoniasis, unspecified: Secondary | ICD-10-CM

## 2017-02-13 HISTORY — DX: Syphilis, unspecified: A53.9

## 2017-02-13 LAB — COMPREHENSIVE METABOLIC PANEL
ALBUMIN: 4 g/dL (ref 3.5–5.0)
ALK PHOS: 68 U/L (ref 38–126)
ALT: 18 U/L (ref 14–54)
AST: 24 U/L (ref 15–41)
Anion gap: 5 (ref 5–15)
BUN: 11 mg/dL (ref 6–20)
CO2: 26 mmol/L (ref 22–32)
Calcium: 9.2 mg/dL (ref 8.9–10.3)
Chloride: 106 mmol/L (ref 101–111)
Creatinine, Ser: 0.67 mg/dL (ref 0.44–1.00)
GFR calc Af Amer: >60 mL/min (ref >60)
GFR calc non Af Amer: >60 mL/min (ref >60)
GLUCOSE: 85 mg/dL (ref 65–99)
POTASSIUM: 3.6 mmol/L (ref 3.5–5.1)
Sodium: 137 mmol/L (ref 135–145)
TOTAL PROTEIN: 8.1 g/dL (ref 6.5–8.1)
Total Bilirubin: 0.2 mg/dL — ABNORMAL LOW (ref 0.3–1.2)

## 2017-02-13 LAB — CBC WITH DIFFERENTIAL/PLATELET
BASOS ABS: 0 10*3/uL (ref 0.0–0.1)
BASOS PCT: 1 %
EOS PCT: 3 %
Eosinophils Absolute: 0.2 10*3/uL (ref 0.0–0.7)
HEMATOCRIT: 35.5 % — AB (ref 36.0–46.0)
Hemoglobin: 11.7 g/dL — ABNORMAL LOW (ref 12.0–15.0)
LYMPHS PCT: 43 %
Lymphs Abs: 2.6 10*3/uL (ref 0.7–4.0)
MCH: 26.4 pg (ref 26.0–34.0)
MCHC: 33 g/dL (ref 30.0–36.0)
MCV: 80.1 fL (ref 78.0–100.0)
MONOS PCT: 7 %
Monocytes Absolute: 0.4 10*3/uL (ref 0.1–1.0)
NEUTROS ABS: 2.9 10*3/uL (ref 1.7–7.7)
Neutrophils Relative %: 46 %
PLATELETS: 268 10*3/uL (ref 150–400)
RBC: 4.43 MIL/uL (ref 3.87–5.11)
RDW: 14.9 % (ref 11.5–15.5)
WBC: 6.2 10*3/uL (ref 4.0–10.5)

## 2017-02-13 LAB — CSF CELL COUNT WITH DIFFERENTIAL
RBC COUNT CSF: 10 /mm3 — AB
RBC Count, CSF: 320 /mm3 — ABNORMAL HIGH
TUBE #: 4
Tube #: 1
WBC, CSF: 1 /mm3 (ref 0–5)
WBC, CSF: 3 /mm3 (ref 0–5)

## 2017-02-13 LAB — GLUCOSE, CSF: GLUCOSE CSF: 59 mg/dL (ref 40–70)

## 2017-02-13 LAB — PROTEIN, CSF: TOTAL PROTEIN, CSF: 17 mg/dL (ref 15–45)

## 2017-02-13 MED ORDER — ACETAMINOPHEN 325 MG PO TABS
650.0000 mg | ORAL_TABLET | Freq: Four times a day (QID) | ORAL | Status: DC | PRN
  Administered 2017-02-13 – 2017-02-14 (×2): 650 mg via ORAL
  Filled 2017-02-13 (×2): qty 2

## 2017-02-13 MED ORDER — ONDANSETRON HCL 4 MG/2ML IJ SOLN: 4.0000 mg | Freq: Four times a day (QID) | INTRAMUSCULAR | Status: DC | PRN

## 2017-02-13 MED ORDER — ACETAMINOPHEN 650 MG RE SUPP: 650.0000 mg | Freq: Four times a day (QID) | RECTAL | Status: DC | PRN

## 2017-02-13 MED ORDER — DIPHENHYDRAMINE HCL 50 MG/ML IJ SOLN
25.0000 mg | Freq: Once | INTRAMUSCULAR | Status: AC
  Administered 2017-02-13: 25 mg via INTRAVENOUS

## 2017-02-13 MED ORDER — MORPHINE SULFATE (PF) 4 MG/ML IV SOLN: 4.0000 mg | Freq: Once | INTRAVENOUS | Status: DC

## 2017-02-13 MED ORDER — PENICILLIN G POTASSIUM 5000000 UNITS IJ SOLR
4.0000 10*6.[IU] | INTRAMUSCULAR | Status: DC
Start: 2017-02-13 — End: 2017-02-15
  Administered 2017-02-13 – 2017-02-15 (×11): 4 10*6.[IU] via INTRAVENOUS
  Filled 2017-02-13 (×13): qty 4

## 2017-02-13 MED ORDER — KETOROLAC TROMETHAMINE 15 MG/ML IJ SOLN
15.0000 mg | Freq: Four times a day (QID) | INTRAMUSCULAR | Status: DC | PRN
  Administered 2017-02-13 – 2017-02-14 (×3): 15 mg via INTRAVENOUS
  Filled 2017-02-13 (×4): qty 1

## 2017-02-13 MED ORDER — SENNOSIDES-DOCUSATE SODIUM 8.6-50 MG PO TABS: 1.0000 | ORAL_TABLET | Freq: Every evening | ORAL | Status: DC | PRN

## 2017-02-13 MED ORDER — ONDANSETRON HCL 4 MG PO TABS: 4.0000 mg | ORAL_TABLET | Freq: Four times a day (QID) | ORAL | Status: DC | PRN

## 2017-02-13 MED ORDER — NICOTINE 21 MG/24HR TD PT24
21.0000 mg | MEDICATED_PATCH | Freq: Once | TRANSDERMAL | Status: AC
  Administered 2017-02-13: 21 mg via TRANSDERMAL
  Filled 2017-02-13: qty 1

## 2017-02-13 MED ORDER — LIDOCAINE HCL (PF) 1 % IJ SOLN
30.0000 mL | Freq: Once | INTRAMUSCULAR | Status: DC
  Filled 2017-02-13: qty 30

## 2017-02-13 MED ORDER — METOCLOPRAMIDE HCL 5 MG/ML IJ SOLN
10.0000 mg | Freq: Once | INTRAMUSCULAR | Status: AC
  Administered 2017-02-13: 10 mg via INTRAVENOUS

## 2017-02-13 MED ORDER — SODIUM CHLORIDE 0.9 % IV BOLUS (SEPSIS)
1000.0000 mL | Freq: Once | INTRAVENOUS | Status: AC
  Administered 2017-02-13: 1000 mL via INTRAVENOUS

## 2017-02-13 MED ORDER — MORPHINE SULFATE (PF) 4 MG/ML IV SOLN
4.0000 mg | Freq: Once | INTRAVENOUS | Status: DC
  Filled 2017-02-13: qty 1

## 2017-02-13 NOTE — ED Triage Notes (Signed)
Pt was told to get treated for positive lab work for syphilis.

## 2017-02-13 NOTE — ED Provider Notes (Signed)
[facility]   CSN: [id number] Arrival date & time: 02/13/17  [id number]     History   Chief Complaint Chief Complaint  Patient presents with  . Follow-up    HPI Tracy Waller is a 26 y.o. female.  HPI   26 year old female with history of chlamydia, trichomoniasis, and recent treatment for STDs here with follow-up. Patient was recently seen and tested for syphilis. Her syphilis antibody was positive and she was advised to return for evaluation. Of note, the patient states that her symptoms started with a painful ulcer in her genital area as well as her mouth approximately 1-2 months ago. Over the last month, she has broken out in a nonpainful, dark, hyperpigmented rash of her feet and chest. She has also developed increasingly severe headaches as well as left arm numbness that comes and goes. She has no history of headaches. She denies any history of rash prior to the last month. No new exposures. Her headache is aching, generalized, and worse with movement. Denies any neck pain or stiffness. Denies any fevers.  Past Medical History:  Diagnosis Date  . Hx of chlamydia infection 2017  . Hx of trichomoniasis 2017  . Hypertension   . Syphilis     Patient Active Problem List   Diagnosis Date Noted  . Partial small bowel obstruction (HCC) 02/13/2017  . Syphilis 02/13/2017  . Anemia 05/27/2013  . H/O cesarean section 05/27/2013  . History of pregnancy induced hypertension 05/27/2013  . Candidiasis of vulva and vagina 02/25/2013  . GERD without esophagitis 02/20/2013    Past Surgical History:  Procedure Laterality Date  . CESAREAN SECTION      OB History    Gravida Para Term Preterm AB Living   3 3 3     3    SAB TAB Ectopic Multiple Live Births           1       Home Medications    Prior to Admission medications   Medication Sig Start Date End Date Taking? Authorizing Provider  doxycycline (VIBRAMYCIN) 100 MG capsule Take 1 capsule (100 mg  total) by mouth 2 (two) times daily. One po bid x 7 days 02/05/17   Melene PlanFloyd, Dan, DO  metroNIDAZOLE (FLAGYL) 500 MG tablet Take 1 tablet (500 mg total) by mouth 2 (two) times daily. One po bid x 7 days 02/05/17   Melene PlanFloyd, Dan, DO  metroNIDAZOLE (METROGEL VAGINAL) 0.75 % vaginal gel Place 1 Applicatorful vaginally 2 (two) times daily. 10/04/16   Judeth HornLawrence, Erin, NP    Family History Family History  Problem Relation Age of Onset  . Asthma Father   . Hypertension Father   . Hypertension Mother   . Hypertension Sister     Social History Social History  Substance Use Topics  . Smoking status: Never Smoker  . Smokeless tobacco: Never Used  . Alcohol use No     Comment: occasionally     Allergies   Patient has no known allergies.   Review of Systems Review of Systems  Constitutional: Positive for fatigue. Negative for fever.  Skin: Positive for rash and wound.  Neurological: Positive for numbness and headaches.  All other systems reviewed and are negative.    Physical Exam Updated Vital Signs BP 128/90   Pulse 65   Temp 99.4 F (37.4 C) (Oral)   Resp 15   Ht 5\' 3"  (1.6 m)   Wt 75.8 kg (167 lb)   LMP 02/05/2017  SpO2 100%   BMI 29.58 kg/m   Physical Exam  Constitutional: She is oriented to person, place, and time. She appears well-developed and well-nourished. No distress.  HENT:  Head: Normocephalic and atraumatic.  Mouth/Throat: Oropharynx is clear and moist.  Eyes: Conjunctivae are normal.  Neck: Neck supple.  Cardiovascular: Normal rate, regular rhythm and normal heart sounds.  Exam reveals no friction rub.   No murmur heard. Pulmonary/Chest: Effort normal and breath sounds normal. No respiratory distress. She has no wheezes. She has no rales.  Abdominal: She exhibits no distension.  Musculoskeletal: She exhibits no edema.  Neurological: She is alert and oriented to person, place, and time. She exhibits normal muscle tone.  Skin: Skin is warm. Capillary refill  takes less than 2 seconds. Rash (Maculopapular, hyperpigmented rash to soles of feet and over anterior chest; no involvement of the palms of hands bilaterally) noted.  Psychiatric: She has a normal mood and affect.  Nursing note and vitals reviewed.   Neurological Exam:  Mental Status: Alert and oriented to person, place, and time. Attention and concentration normal. Speech clear. Recent memory is intact. Cranial Nerves: Visual fields grossly intact. EOMI and PERRLA. No nystagmus noted. Facial sensation intact at forehead, maxillary cheek, and chin/mandible bilaterally. No facial asymmetry or weakness. Hearing grossly normal. Uvula is midline, and palate elevates symmetrically. Normal SCM and trapezius strength. Tongue midline without fasciculations. Motor: Muscle strength 5/5 in proximal and distal UE and LE bilaterally. No pronator drift. Muscle tone normal. Reflexes: 2+ and symmetrical in all four extremities.  Sensation: Intact to light touch in upper and lower extremities distally bilaterally.  Gait: Normal without ataxia. Coordination: Normal FTN bilaterally.     ED Treatments / Results  Labs (all labs ordered are listed, but only abnormal results are displayed) Labs Reviewed  CBC WITH DIFFERENTIAL/PLATELET - Abnormal; Notable for the following:       Result Value   Hemoglobin 11.7 (*)    HCT 35.5 (*)    All other components within normal limits  COMPREHENSIVE METABOLIC PANEL - Abnormal; Notable for the following:    Total Bilirubin 0.2 (*)    All other components within normal limits  CSF CELL COUNT WITH DIFFERENTIAL - Abnormal; Notable for the following:    RBC Count, CSF 320 (*)    All other components within normal limits  CSF CELL COUNT WITH DIFFERENTIAL - Abnormal; Notable for the following:    RBC Count, CSF 10 (*)    All other components within normal limits  CSF CULTURE  GLUCOSE, CSF  PROTEIN, CSF  VDRL, CSF    EKG  EKG Interpretation None        Radiology Ct Head Wo Contrast  Result Date: 02/13/2017 CLINICAL DATA:  Positive syphilis assessed and headaches EXAM: CT HEAD WITHOUT CONTRAST TECHNIQUE: Contiguous axial images were obtained from the base of the skull through the vertex without intravenous contrast. COMPARISON:  None. FINDINGS: Brain: No evidence of acute infarction, hemorrhage, hydrocephalus, extra-axial collection or mass lesion/mass effect. Vascular: No hyperdense vessel or unexpected calcification. Skull: Normal. Negative for fracture or focal lesion. Sinuses/Orbits: No acute finding. Other: None. IMPRESSION: No acute abnormality noted. Electronically Signed   By: Alcide Clever M.D.   On: 02/13/2017 10:56    Procedures .Lumbar Puncture Date/Time: 02/13/2017 1:05 PM Performed by: Shaune Pollack Authorized by: Shaune Pollack   Consent:    Consent obtained:  Written   Consent given by:  Patient   Risks discussed:  Bleeding, headache, nerve  damage, infection, pain and repeat procedure   Alternatives discussed:  Alternative treatment Pre-procedure details:    Procedure purpose:  Diagnostic   Preparation: Patient was prepped and draped in usual sterile fashion   Procedure details:    Lumbar space:  L3-L4 interspace   Patient position:  L lateral decubitus   Needle gauge:  20   Needle type:  Spinal needle - Quincke tip   Needle length (in):  3.5   Ultrasound guidance: no     Number of attempts:  2   Opening pressure (cm H2O):  34   Closing pressure (cm H2O):  20   Fluid appearance:  Blood-tinged then clearing   Tubes of fluid:  4   Total volume (ml):  15 Post-procedure:    Puncture site:  Adhesive bandage applied and direct pressure applied   Patient tolerance of procedure:  Tolerated well, no immediate complications   (including critical care time)  Medications Ordered in ED Medications  lidocaine (PF) (XYLOCAINE) 1 % injection 30 mL (30 mLs Intradermal Not Given 02/13/17 1236)  morphine 4 MG/ML  injection 4 mg (4 mg Intravenous Not Given 02/13/17 1357)  nicotine (NICODERM CQ - dosed in mg/24 hours) patch 21 mg (21 mg Transdermal Patch Applied 02/13/17 1424)  metoCLOPramide (REGLAN) injection 10 mg (10 mg Intravenous Given 02/13/17 1106)  diphenhydrAMINE (BENADRYL) injection 25 mg (25 mg Intravenous Given 02/13/17 1104)  sodium chloride 0.9 % bolus 1,000 mL (0 mLs Intravenous Stopped 02/13/17 1156)     Initial Impression / Assessment and Plan / ED Course  I have reviewed the triage vital signs and the nursing notes.  Pertinent labs & imaging results that were available during my care of the patient were reviewed by me and considered in my medical decision making (see chart for details).    25 year old female with history of multiple STDs here with known positive syphilis titers. 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 CT head is without evidence of hydrocephalus so LP performed. 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 I discussed with Dr. Algis Liming of infectious disease. Will transferred [facility] for IV penicillin and further workup.  Final Clinical Impressions(s) / ED Diagnoses   Final diagnoses:  Tertiary syphilis    New Prescriptions New Prescriptions   No medications on file     Shaune Pollack, MD 02/13/17 [phone]

## 2017-02-13 NOTE — H&P (Signed)
History and Physical    Tracy Waller:[id number] DOB: [birth year]  DOA: 02/13/2017 PCP: Patient, No Pcp Per  Patient coming from: Home  Chief Complaint: Rash and headache  HPI: Tracy Waller is a 26 y.o. female with medical history significant of recently diagnosed with STDs. Treated for chlamydia and trichomonas and found to be positive for syphilis, presented to [facility], for evaluation of positive syphilis antibodies. During ED consult patient reported that she had a ulcer on her mouth 2 months ago and subsequently developed a dark hyperpigmented rash on her feet and chest. She also has developed headaches intermittently that resolved on and recently developing left arm numbness that is on and off. EDP consulted infectious disease physician recommended LP to rule out neurogenic syphilis. Patient denies neck stiffness, nausea, vomiting, blurred vision or photophobia.  ED Course: LP was done at [facility], Lab unremarkable. ID recommended to start patient on high-dose penicillin.  Review of Systems:   General: no changes in body weight, no fever chills or decrease in energy.  HEENT: no blurry vision, hearing changes or sore throat Respiratory: no dyspnea, coughing, wheezing CV: no chest pain, no palpitations GI: no nausea, vomiting, abdominal pain, diarrhea, constipation GU: no dysuria, burning on urination, increased urinary frequency, hematuria  Ext:. No deformities,  Neuro: Positive for headaches, bilateral tingling and numbness of the hand. Skin: See history of present illness and picture on physical exam MSK: No muscle spasm, no deformity, no limitation of range of movement in spin Heme: No easy bruising.  Travel history: No recent long distant travel.   Past Medical History:  Diagnosis Date  . Hx of chlamydia infection 2017  . Hx of trichomoniasis 2017  . Syphilis     Past Surgical History:  Procedure Laterality Date  . CESAREAN SECTION       reports  that she has been smoking Cigarettes.  She has never used smokeless tobacco. She reports that she uses drugs, including Marijuana. She reports that she does not drink alcohol.  No Known Allergies  Family History  Problem Relation Age of Onset  . Asthma Father   . Hypertension Father   . Hypertension Mother   . Hypertension Sister    Family history reviewed and pertinent  Prior to Admission medications   Medication Sig Start Date End Date Taking? Authorizing Provider  doxycycline (VIBRAMYCIN) 100 MG capsule Take 1 capsule (100 mg total) by mouth 2 (two) times daily. One po bid x 7 days Patient not taking: Reported on 02/13/2017 02/05/17   Melene Plan, DO  metroNIDAZOLE (FLAGYL) 500 MG tablet Take 1 tablet (500 mg total) by mouth 2 (two) times daily. One po bid x 7 days Patient not taking: Reported on 02/13/2017 02/05/17   Melene Plan, DO  metroNIDAZOLE (METROGEL VAGINAL) 0.75 % vaginal gel Place 1 Applicatorful vaginally 2 (two) times daily. Patient not taking: Reported on 02/13/2017 10/04/16   Judeth Horn, NP    Physical Exam: Vitals:   02/13/17 1130 02/13/17 1251 02/13/17 1330 02/13/17 1643  BP: 128/83 111/79 128/90 (!) 123/96  Pulse: (!) 59 (!) 57 65 81  Resp: 13 18 15 18   Temp:    98.2 F (36.8 C)  TempSrc:    Oral  SpO2: 100% 100% 100% 100%  Weight:      Height:         Constitutional: NAD, calm, comfortable Eyes: PERRL, lids and conjunctivae normal ENMT: Mucous membranes are moist. Posterior pharynx clear of any exudate  or lesions. Neck: normal, supple, no masses, no thyromegaly Respiratory: clear to auscultation bilaterally, no wheezing, no crackles. Normal respiratory effort. Cardiovascular: Regular rate and rhythm, no murmurs / rubs / gallops. No extremity edema. 2+ pedal pulses.  Abdomen: no tenderness, no masses palpated. No hepatosplenomegaly. Bowel sounds positive.  Musculoskeletal: no clubbing / cyanosis. No joint deformity upper and lower extremities. Good ROM,  no contractures. Normal muscle tone.  Skin:    Neurologic: CN 2-12 grossly intact. Sensation intact, DTR normal. Strength 5/5 in all 4.  Psychiatric: Normal judgment and insight. Alert and oriented x 3. Normal mood.    Labs on Admission: I have personally reviewed following labs and imaging studies  CBC:  Recent Labs Lab 02/13/17 1057  WBC 6.2  NEUTROABS 2.9  HGB 11.7*  HCT 35.5*  MCV 80.1  PLT 268   Basic Metabolic Panel:  Recent Labs Lab 02/13/17 1057  NA 137  K 3.6  CL 106  CO2 26  GLUCOSE 85  BUN 11  CREATININE 0.67  CALCIUM 9.2   GFR: Estimated Creatinine Clearance: 104.9 mL/min (by C-G formula based on SCr of 0.67 mg/dL). Liver Function Tests:  Recent Labs Lab 02/13/17 1057  AST 24  ALT 18  ALKPHOS 68  BILITOT 0.2*  PROT 8.1  ALBUMIN 4.0   Urine analysis:    Component Value Date/Time   COLORURINE YELLOW 02/05/2017 0111   APPEARANCEUR CLOUDY (A) 02/05/2017 0111   LABSPEC 1.023 02/05/2017 0111   PHURINE 7.0 02/05/2017 0111   GLUCOSEU NEGATIVE 02/05/2017 0111   HGBUR NEGATIVE 02/05/2017 0111   BILIRUBINUR NEGATIVE 02/05/2017 0111   BILIRUBINUR NEGATIVE 06/03/2013 1104   KETONESUR NEGATIVE 02/05/2017 0111   PROTEINUR NEGATIVE 02/05/2017 0111   UROBILINOGEN 1.0 05/31/2014 1920   NITRITE NEGATIVE 02/05/2017 0111   LEUKOCYTESUR SMALL (A) 02/05/2017 0111   ) Recent Results (from the past 240 hour(s))  Wet prep, genital     Status: Abnormal   Collection Time: 02/05/17  1:19 AM  Result Value Ref Range Status   Yeast Wet Prep HPF POC NONE SEEN NONE SEEN Final   Trich, Wet Prep PRESENT (A) NONE SEEN Final   Clue Cells Wet Prep HPF POC PRESENT (A) NONE SEEN Final   WBC, Wet Prep HPF POC MANY (A) NONE SEEN Final   Sperm NONE SEEN  Final  CSF culture     Status: None (Preliminary result)   Collection Time: 02/13/17 12:24 PM  Result Value Ref Range Status   Specimen Description CSF  Final   Special Requests CSF 2CC  Final   Gram Stain   Final     NO WBC SEEN NO ORGANISMS SEEN CYTOSPIN SMEAR Performed at [facility], [address], [place], [place] [address]    Culture PENDING  Incomplete   Report Status PENDING  Incomplete     Radiological Exams on Admission: Ct Head Wo Contrast  Result Date: 02/13/2017 CLINICAL DATA:  Positive syphilis assessed and headaches EXAM: CT HEAD WITHOUT CONTRAST TECHNIQUE: Contiguous axial images were obtained from the base of the skull through the vertex without intravenous contrast. COMPARISON:  None. FINDINGS: Brain: No evidence of acute infarction, hemorrhage, hydrocephalus, extra-axial collection or mass lesion/mass effect. Vascular: No hyperdense vessel or unexpected calcification. Skull: Normal. Negative for fracture or focal lesion. Sinuses/Orbits: No acute finding. Other: None. IMPRESSION: No acute abnormality noted. Electronically Signed   By: Alcide Clever M.D.   On: 02/13/2017 10:56    Assessment/Plan Syphilis - r/o neurosyphilis, unlikely  to be meningitis, as no clinical correlation is shown. Admit to medsurg  [facility], so far analysis negative  - VDRL pending  CT head normal  ID consulted  Started on high dose IV penicillin   Headaches Pain management PRN   Normocytic anemia  No signs of over bleeding likely iron deficient  Monitor for now   Hx of STD - Chlamydia and Trichomonas   DVT prophylaxis: Early ambulation  Code Status: Full  Family Communication: Family updated  Disposition Plan: Anticipate discharge to previous home environment.  Consults called: ID - Dr Daiva EvesVan Dam  Admission status: Inpatient - Med surg    Latrelle DodrillEdwin Silva MD [facility] Pager: Text Page via [email or web address]  [phone]  If 7PM-7AM, please contact night-coverage [email or web address] Password [email or web address]  02/13/2017, 7:09 PM

## 2017-02-13 NOTE — ED Notes (Signed)
Patient transported to CT 

## 2017-02-13 NOTE — ED Notes (Signed)
ED Provider at bedside. 

## 2017-02-14 ENCOUNTER — Telehealth: Payer: Self-pay | Admitting: Infectious Disease

## 2017-02-14 DIAGNOSIS — A599 Trichomoniasis, unspecified: Secondary | ICD-10-CM

## 2017-02-14 DIAGNOSIS — Z8249 Family history of ischemic heart disease and other diseases of the circulatory system: Secondary | ICD-10-CM

## 2017-02-14 DIAGNOSIS — Z825 Family history of asthma and other chronic lower respiratory diseases: Secondary | ICD-10-CM

## 2017-02-14 DIAGNOSIS — A523 Neurosyphilis, unspecified: Secondary | ICD-10-CM

## 2017-02-14 DIAGNOSIS — A749 Chlamydial infection, unspecified: Secondary | ICD-10-CM

## 2017-02-14 DIAGNOSIS — Z202 Contact with and (suspected) exposure to infections with a predominantly sexual mode of transmission: Secondary | ICD-10-CM

## 2017-02-14 DIAGNOSIS — F17210 Nicotine dependence, cigarettes, uncomplicated: Secondary | ICD-10-CM

## 2017-02-14 DIAGNOSIS — A5901 Trichomonal vulvovaginitis: Secondary | ICD-10-CM

## 2017-02-14 DIAGNOSIS — Z7251 High risk heterosexual behavior: Secondary | ICD-10-CM

## 2017-02-14 DIAGNOSIS — Z717 Human immunodeficiency virus [HIV] counseling: Secondary | ICD-10-CM

## 2017-02-14 DIAGNOSIS — Z8619 Personal history of other infectious and parasitic diseases: Secondary | ICD-10-CM

## 2017-02-14 DIAGNOSIS — A539 Syphilis, unspecified: Principal | ICD-10-CM

## 2017-02-14 LAB — COMPREHENSIVE METABOLIC PANEL
ALK PHOS: 62 U/L (ref 38–126)
ALT: 17 U/L (ref 14–54)
AST: 24 U/L (ref 15–41)
Albumin: 3.9 g/dL (ref 3.5–5.0)
Anion gap: 6 (ref 5–15)
BUN: 11 mg/dL (ref 6–20)
CALCIUM: 8.7 mg/dL — AB (ref 8.9–10.3)
CO2: 24 mmol/L (ref 22–32)
Chloride: 107 mmol/L (ref 101–111)
Creatinine, Ser: 0.75 mg/dL (ref 0.44–1.00)
GFR calc Af Amer: >60 mL/min (ref >60)
GFR calc non Af Amer: >60 mL/min (ref >60)
GLUCOSE: 116 mg/dL — AB (ref 65–99)
Potassium: 3.4 mmol/L — ABNORMAL LOW (ref 3.5–5.1)
SODIUM: 137 mmol/L (ref 135–145)
Total Bilirubin: 0.7 mg/dL (ref 0.3–1.2)
Total Protein: 7.3 g/dL (ref 6.5–8.1)

## 2017-02-14 LAB — CBC
HEMATOCRIT: 36.5 % (ref 36.0–46.0)
HEMOGLOBIN: 11.4 g/dL — AB (ref 12.0–15.0)
MCH: 25.3 pg — ABNORMAL LOW (ref 26.0–34.0)
MCHC: 31.2 g/dL (ref 30.0–36.0)
MCV: 81.1 fL (ref 78.0–100.0)
Platelets: 263 10*3/uL (ref 150–400)
RBC: 4.5 MIL/uL (ref 3.87–5.11)
RDW: 14.6 % (ref 11.5–15.5)
WBC: 6 10*3/uL (ref 4.0–10.5)

## 2017-02-14 LAB — VDRL, CSF: SYPHILIS VDRL QUANT CSF: NONREACTIVE

## 2017-02-14 MED ORDER — NON FORMULARY: 1.0000 | Freq: Every day | Status: DC

## 2017-02-14 MED ORDER — EMTRICITABINE-TENOFOVIR DF 200-300 MG PO TABS
1.0000 | ORAL_TABLET | Freq: Every day | ORAL | Status: DC
  Administered 2017-02-14 – 2017-02-15 (×2): 1 via ORAL
  Filled 2017-02-14 (×2): qty 1

## 2017-02-14 MED ORDER — POTASSIUM CHLORIDE CRYS ER 20 MEQ PO TBCR
40.0000 meq | EXTENDED_RELEASE_TABLET | Freq: Once | ORAL | Status: AC
  Administered 2017-02-14: 40 meq via ORAL
  Filled 2017-02-14: qty 2

## 2017-02-14 MED ORDER — METRONIDAZOLE 500 MG PO TABS
500.0000 mg | ORAL_TABLET | Freq: Two times a day (BID) | ORAL | Status: DC
  Administered 2017-02-14: 500 mg via ORAL
  Filled 2017-02-14: qty 1

## 2017-02-14 MED ORDER — DIPHENHYDRAMINE HCL 50 MG/ML IJ SOLN
25.0000 mg | Freq: Four times a day (QID) | INTRAMUSCULAR | Status: DC | PRN
  Administered 2017-02-14: 25 mg via INTRAVENOUS
  Filled 2017-02-14: qty 1

## 2017-02-14 MED ORDER — EMTRICITABINE-TENOFOVIR DF 200-300 MG PO TABS: 1.0000 | ORAL_TABLET | Freq: Every day | ORAL | 0 refills | Status: DC

## 2017-02-14 NOTE — Telephone Encounter (Signed)
Patient with admission for possible neurosyphilis.   She has mx hx of STI esp chlamydia and is AA women living in [place]  Her boyfriend is also + for syphilis  Her risk for HIV is VERY< VERY< VERY HIGH  Therefore I will try to get her a 30 day supply of Truv[facility] which hopefully we can bring to her prior to DC from the hospital  [facility] to [facility] to her if they will let me pick up vs ask pharmacy to do this

## 2017-02-14 NOTE — Progress Notes (Addendum)
PROGRESS NOTE    Tracy Waller  ZOX:[id number] DOB: 1990-11-29 DOA: 02/13/2017 PCP: Patient, No Pcp Per   Brief Narrative: Tracy Waller is a 26 y.o. female with medical history significant of recently diagnosed with STDs. Treated for chlamydia and trichomonas and found to be positive for syphilis, presented to [facility], for evaluation of positive syphilis antibodies. During ED consult patient reported that she had a ulcer on her mouth 2 months ago and subsequently developed a dark hyperpigmented rash on her feet and chest. She also has developed headaches intermittently that resolved on and recently developing left arm numbness that is on and off. EDP consulted infectious disease physician recommended LP to rule out neurogenic syphilis. Patient denies neck stiffness, nausea, vomiting, blurred vision or photophobia.    Assessment & Plan:   Active Problems:   Syphilis  Syphilis; RPR positive/  LP was done at [facility] pressure 34, so far analysis negative  - VDRL CSF pending  R/O neurosyphilis.  Has plantar rash.  On High dose IV penicillin.  ID consulted.  HIV RNA pending.   Normocytic anemia; Hb stable.   Chlamydia and trichomonas treated per HPI report.  Will need to discussed with patient to assure she finished treatment  Addendum; Patient didn't take flagyl. Will start flagyl. Per ID note she received ceftriaxone and azithro.  Hypokalemia; replete.   DVT prophylaxis: SCD Code Status: Full code.  Family Communication: care discussed with patient.  Disposition Plan: To be determine.    Consultants:   ID   Procedures:   LP   Antimicrobials:  Penicillin.    Subjective: Still with headaches, on and off.  Has rash plantar area.   Objective: Vitals:   02/13/17 1643 02/13/17 2107 02/14/17 0500 02/14/17 0602  BP: (!) 123/96 122/77  116/70  Pulse: 81 60  70  Resp: 18 16  17   Temp: 98.2 F (36.8 C) 98.2 F (36.8 C)  98.2 F (36.8 C)    TempSrc: Oral Oral Oral Oral  SpO2: 100% 100%  100%  Weight:      Height:        Intake/Output Summary (Last 24 hours) at 02/14/17 1417 Last data filed at 02/14/17 1030  Gross per 24 hour  Intake             1120 ml  Output                0 ml  Net             1120 ml   Filed Weights   02/13/17 0945  Weight: 75.8 kg (167 lb)    Examination:  General exam: Appears calm and comfortable  Respiratory system: Clear to auscultation. Respiratory effort normal. Cardiovascular system: S1 & S2 heard, RRR. No JVD, murmurs, rubs, gallops or clicks. No pedal edema. Gastrointestinal system: Abdomen is nondistended, soft and nontender. No organomegaly or masses felt. Normal bowel sounds heard. Central nervous system: Alert and oriented. No focal neurological deficits. Extremities: Symmetric 5 x 5 power. Skin: rash plantar area.  Psychiatry: Judgement and insight appear normal. Mood & affect appropriate.     Data Reviewed: I have personally reviewed following labs and imaging studies  CBC:  Recent Labs Lab 02/13/17 1057 02/14/17 0713  WBC 6.2 6.0  NEUTROABS 2.9  --   HGB 11.7* 11.4*  HCT 35.5* 36.5  MCV 80.1 81.1  PLT 268 263   Basic Metabolic Panel:  Recent Labs Lab 02/13/17 1057 02/14/17  0713  NA 137 137  K 3.6 3.4*  CL 106 107  CO2 26 24  GLUCOSE 85 116*  BUN 11 11  CREATININE 0.67 0.75  CALCIUM 9.2 8.7*   GFR: Estimated Creatinine Clearance: 104.9 mL/min (by C-G formula based on SCr of 0.75 mg/dL). Liver Function Tests:  Recent Labs Lab 02/13/17 1057 02/14/17 0713  AST 24 24  ALT 18 17  ALKPHOS 68 62  BILITOT 0.2* 0.7  PROT 8.1 7.3  ALBUMIN 4.0 3.9   No results for input(s): LIPASE, AMYLASE in the last 168 hours. No results for input(s): AMMONIA in the last 168 hours. Coagulation Profile: No results for input(s): INR, PROTIME in the last 168 hours. Cardiac Enzymes: No results for input(s): CKTOTAL, CKMB, CKMBINDEX, TROPONINI in the last 168  hours. BNP (last 3 results) No results for input(s): PROBNP in the last 8760 hours. HbA1C: No results for input(s): HGBA1C in the last 72 hours. CBG: No results for input(s): GLUCAP in the last 168 hours. Lipid Profile: No results for input(s): CHOL, HDL, LDLCALC, TRIG, CHOLHDL, LDLDIRECT in the last 72 hours. Thyroid Function Tests: No results for input(s): TSH, T4TOTAL, FREET4, T3FREE, THYROIDAB in the last 72 hours. Anemia Panel: No results for input(s): VITAMINB12, FOLATE, FERRITIN, TIBC, IRON, RETICCTPCT in the last 72 hours. Sepsis Labs: No results for input(s): PROCALCITON, LATICACIDVEN in the last 168 hours.  Recent Results (from the past 240 hour(s))  Wet prep, genital     Status: Abnormal   Collection Time: 02/05/17  1:19 AM  Result Value Ref Range Status   Yeast Wet Prep HPF POC NONE SEEN NONE SEEN Final   Trich, Wet Prep PRESENT (A) NONE SEEN Final   Clue Cells Wet Prep HPF POC PRESENT (A) NONE SEEN Final   WBC, Wet Prep HPF POC MANY (A) NONE SEEN Final   Sperm NONE SEEN  Final  CSF culture     Status: None (Preliminary result)   Collection Time: 02/13/17 12:24 PM  Result Value Ref Range Status   Specimen Description CSF  Final   Special Requests CSF 2CC  Final   Gram Stain NO WBC SEEN NO ORGANISMS SEEN CYTOSPIN SMEAR   Final   Culture   Final    NO GROWTH < 24 HOURS Performed at [facility], [address] [place], [place] [address]    Report Status PENDING  Incomplete         Radiology Studies: Ct Head Wo Contrast  Result Date: 02/13/2017 CLINICAL DATA:  Positive syphilis assessed and headaches EXAM: CT HEAD WITHOUT CONTRAST TECHNIQUE: Contiguous axial images were obtained from the base of the skull through the vertex without intravenous contrast. COMPARISON:  None. FINDINGS: Brain: No evidence of acute infarction, hemorrhage, hydrocephalus, extra-axial collection or mass lesion/mass effect. Vascular: No hyperdense vessel or unexpected  calcification. Skull: Normal. Negative for fracture or focal lesion. Sinuses/Orbits: No acute finding. Other: None. IMPRESSION: No acute abnormality noted. Electronically Signed   By: Alcide CleverMark  Lukens M.D.   On: 02/13/2017 10:56        Scheduled Meds: . lidocaine (PF)  30 mL Intradermal Once  . nicotine  21 mg Transdermal Once  . potassium chloride  40 mEq Oral Once   Continuous Infusions: . pencillin G potassium IV Stopped (02/14/17 1148)     LOS: 1 day    Time spent: 25 minutes.     Alba Coryegalado, Janaisha Tolsma A, MD [facility] Pager [phone]  If 7PM-7AM, please contact night-coverage [email or web address] Password  [email or web address] 02/14/2017, 2:17 PM

## 2017-02-14 NOTE — Progress Notes (Signed)
Informed Regaldo MD, that patient did not get the prescriptions filled from last ED visit on 5/14

## 2017-02-14 NOTE — Care Management Note (Signed)
Case Management Note  Patient Details  Name: Tracy BolognaKeria C Rech MRN: [id number] Date of Birth: December 02, 1990  Subjective/Objective:                  26 y.o. female with medical history significant of recently diagnosed with STDs. Treated for chlamydia and trichomonas and found to be positive for syphilis, [facility], for evaluation of positive syphilis antibodies. During ED consult patient reported that she had a ulcer on her mouth 2 months ago and subsequently developed a dark hyperpigmented rash on her feet and chest. She also has developed headaches intermittently that resolved on and recently developing left arm numbness that is on and off. EDP consulted infectious disease physician recommended LP to rule out neurogenic syphilis. Patient denies neck stiffness, nausea, vomiting, blurred vision or photophobia.  Action/Plan: Date:  Feb 14, 2017 Chart reviewed for concurrent status and case management needs. Will continue to follow patient progress. Discharge Planning: following for needs Expected discharge date: [id number] Marcelle SmilingRhonda Davis, BSN, Flow[place]RN3, [place]   [phone]  Expected Discharge Date:                  Expected Discharge Plan:  Home/Self Care  In-House Referral:     Discharge planning Services  CM Consult  Post Acute Care Choice:    Choice offered to:     DME Arranged:    DME Agency:     HH Arranged:    HH Agency:     Status of Service:  In process, will continue to follow  If discussed at Long Length of Stay Meetings, dates discussed:    Additional Comments:  Golda AcreDavis, Rhonda Lynn, RN 02/14/2017, 8:51 AM

## 2017-02-14 NOTE — Consult Note (Signed)
Date of Admission:  02/13/2017  Date of Consult:  02/14/2017  Reason for Consult: Concern for Neurosyphilis Referring Physician: Dr. Tyrell Antonio   HPI: Tracy Waller is an 26 y.o. female with recurrent STI, in particular chlamydia with documented chlamydia infection in  2011, September 2017, October 2017, January 2018, Trichomonas in May 14th, 2018, who was also + for syphilis on the 14th with titer of 1:32.   On that date she had rx with Cefrriaxone and azithromycin for presumptive GC and chlamydia ( she tested negative for these but + for Trichomonas and syphilis).   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 She also endorsed a severe HA with numbness in her hands.   She underwent LP which had OP of 35cmH20 and she was sent to [facility] for treament for possible Neurosyphilis  CSF WBC protein and glucose are unremarkable.   CSF VDRL is pending  I cannot tell if FTA-Abs were done on CSF.  She has tolerated the PCN fine but HA persists.    Past Medical History:  Diagnosis Date  . Hx of chlamydia infection 2017  . Hx of trichomoniasis 2017  . Syphilis     Past Surgical History:  Procedure Laterality Date  . CESAREAN SECTION      Social History:  reports that she has been smoking Cigarettes.  She has never used smokeless tobacco. She reports that she uses drugs, including Marijuana. She reports that she does not drink alcohol.   Family History  Problem Relation Age of Onset  . Asthma Father   . Hypertension Father   . Hypertension Mother   . Hypertension Sister     No Known Allergies   Medications: I have reviewed patients current medications as documented in Epic Anti-infectives    Start     Dose/Rate Route Frequency Ordered Stop   02/14/17 1600  emtricitabine-tenofovir (TRUVADA) 200-300 MG per tablet 1 tablet     1 tablet Oral Daily 02/14/17 1446     02/13/17 1800  penicillin G potassium 4  Million Units in dextrose 5 % 250 mL IVPB     4 Million Units 250 mL/hr over 60 Minutes Intravenous Every 4 hours 02/13/17 1725           ROS: as in HPI otherwise remainder of 12 point Review of Systems is negative  Blood pressure (!) 145/67, pulse (!) 57, temperature 98.6 F (37 C), temperature source Oral, resp. rate 18, height _0  (1.6 m), weight 167 lb (75.8 kg), last menstrual period 02/05/2017, SpO2 100 %. General: Alert and awake, oriented x3, not in any acute distress lying in bed with her boyfriend HEENT: anicteric sclera,  EOMI, oropharynx clear and without exudate Cardiovascular: egular rate, normal r,  no murmur rubs or gallops Pulmonary: clear to auscultation bilaterally, no wheezing, rales or rhonchi Gastrointestinal: soft nontender, nondistended, normal bowel sounds, Musculoskeletal: no  clubbing or edema noted bilaterally Skin,rash see soles   02/14/17:        Neuro: nonfocal, strength and sensation intact   Results for orders placed or performed during the hospital encounter of 02/13/17 (from the past 48 hour(s))  CBC with Differential     Status: Abnormal   Collection Time: 02/13/17 10:57 AM  Result Value Ref Range   WBC 6.2 4.0 - 10.5 K/uL   RBC 4.43 3.87 - 5.11 MIL/uL   Hemoglobin 11.7 (L) 12.0 -  15.0 g/dL   HCT 35.5 (L) 36.0 - 46.0 %   MCV 80.1 78.0 - 100.0 fL   MCH 26.4 26.0 - 34.0 pg   MCHC 33.0 30.0 - 36.0 g/dL   RDW 14.9 11.5 - 15.5 %   Platelets 268 150 - 400 K/uL   Neutrophils Relative % 46 %   Neutro Abs 2.9 1.7 - 7.7 K/uL   Lymphocytes Relative 43 %   Lymphs Abs 2.6 0.7 - 4.0 K/uL   Monocytes Relative 7 %   Monocytes Absolute 0.4 0.1 - 1.0 K/uL   Eosinophils Relative 3 %   Eosinophils Absolute 0.2 0.0 - 0.7 K/uL   Basophils Relative 1 %   Basophils Absolute 0.0 0.0 - 0.1 K/uL  Comprehensive metabolic panel     Status: Abnormal   Collection Time: 02/13/17 10:57 AM  Result Value Ref Range   Sodium 137 135 - 145 mmol/L   Potassium  3.6 3.5 - 5.1 mmol/L   Chloride 106 101 - 111 mmol/L   CO2 26 22 - 32 mmol/L   Glucose, Bld 85 65 - 99 mg/dL   BUN 11 6 - 20 mg/dL   Creatinine, Ser 0.67 0.44 - 1.00 mg/dL   Calcium 9.2 8.9 - 10.3 mg/dL   Total Protein 8.1 6.5 - 8.1 g/dL   Albumin 4.0 3.5 - 5.0 g/dL   AST 24 15 - 41 U/L   ALT 18 14 - 54 U/L   Alkaline Phosphatase 68 38 - 126 U/L   Total Bilirubin 0.2 (L) 0.3 - 1.2 mg/dL   GFR calc non Af Amer >60 >60 mL/min   GFR calc Af Amer >60 >60 mL/min    Comment: (NOTE) The eGFR has been calculated using the CKD EPI equation. This calculation has not been validated in all clinical situations. eGFR's persistently <60 mL/min signify possible Chronic Kidney Disease.    Anion gap 5 5 - 15  CSF cell count with differential collection tube #: 1     Status: Abnormal   Collection Time: 02/13/17 12:24 PM  Result Value Ref Range   Tube # 1    Color, CSF COLORLESS COLORLESS   Appearance, CSF CLEAR CLEAR   Supernatant NOT INDICATED    RBC Count, CSF 320 (H) 0 /cu mm   WBC, CSF 3 0 - 5 /cu mm   Segmented Neutrophils-CSF RARE 0 - 6 %   Lymphs, CSF RARE 40 - 80 %   Other Cells, CSF TOO FEW TO COUNT, SMEAR AVAILABLE FOR REVIEW     Comment: Performed at [facility] [address] [place], [place] [address]  CSF culture     Status: None (Preliminary result)   Collection Time: 02/13/17 12:24 PM  Result Value Ref Range   Specimen Description CSF    Special Requests CSF 2CC    Gram Stain NO WBC SEEN NO ORGANISMS SEEN CYTOSPIN SMEAR     Culture      NO GROWTH < 24 HOURS Performed at [facility], [place] [address], [place], [place] [address]    Report Status PENDING   Glucose, CSF     Status: None   Collection Time: 02/13/17 12:25 PM  Result Value Ref Range   Glucose, CSF 59 40 - 70 mg/dL  Protein, CSF     Status: None   Collection Time: 02/13/17 12:25 PM  Result Value Ref Range   Total  Protein, CSF 17 15 - 45 mg/dL  VDRL, CSF  Status: None   Collection Time:  02/13/17 12:25 PM  Result Value Ref Range   VDRL Quant, CSF Non Reactive Non Rea:<1:1    Comment: (NOTE) Performed At: [facility] [address] [place], [place] [id number] Lindon Romp MD DX:[id number]   CSF cell count with differential collection tube #: 4     Status: Abnormal   Collection Time: 02/13/17 12:26 PM  Result Value Ref Range   Tube # 4    Color, CSF COLORLESS COLORLESS   Appearance, CSF CLEAR CLEAR   Supernatant NOT INDICATED    RBC Count, CSF 10 (H) 0 /cu mm   WBC, CSF 1 0 - 5 /cu mm   Other Cells, CSF TOO FEW TO COUNT, SMEAR AVAILABLE FOR REVIEW     Comment: RARE LYMPHS Performed at [facility], [place] [address] [place], [place] [address]   CBC     Status: Abnormal   Collection Time: 02/14/17  7:13 AM  Result Value Ref Range   WBC 6.0 4.0 - 10.5 K/uL   RBC 4.50 3.87 - 5.11 MIL/uL   Hemoglobin 11.4 (L) 12.0 - 15.0 g/dL   HCT 36.5 36.0 - 46.0 %   MCV 81.1 78.0 - 100.0 fL   MCH 25.3 (L) 26.0 - 34.0 pg   MCHC 31.2 30.0 - 36.0 g/dL   RDW 14.6 11.5 - 15.5 %   Platelets 263 150 - 400 K/uL  Comprehensive metabolic panel     Status: Abnormal   Collection Time: 02/14/17  7:13 AM  Result Value Ref Range   Sodium 137 135 - 145 mmol/L   Potassium 3.4 (L) 3.5 - 5.1 mmol/L   Chloride 107 101 - 111 mmol/L   CO2 24 22 - 32 mmol/L   Glucose, Bld 116 (H) 65 - 99 mg/dL   BUN 11 6 - 20 mg/dL   Creatinine, Ser 0.75 0.44 - 1.00 mg/dL   Calcium 8.7 (L) 8.9 - 10.3 mg/dL   Total Protein 7.3 6.5 - 8.1 g/dL   Albumin 3.9 3.5 - 5.0 g/dL   AST 24 15 - 41 U/L   ALT 17 14 - 54 U/L   Alkaline Phosphatase 62 38 - 126 U/L   Total Bilirubin 0.7 0.3 - 1.2 mg/dL   GFR calc non Af Amer >60 >60 mL/min   GFR calc Af Amer >60 >60 mL/min    Comment: (NOTE) The eGFR has been calculated using the CKD EPI equation. This calculation has not been validated in all clinical situations. eGFR's persistently <60 mL/min signify possible Chronic Kidney Disease.    Anion gap 6 5  - 15   _0 (sdes,specrequest,cult,reptstatus)   ) Recent Results (from the past 720 hour(s))  Wet prep, genital     Status: Abnormal   Collection Time: 02/05/17  1:19 AM  Result Value Ref Range Status   Yeast Wet Prep HPF POC NONE SEEN NONE SEEN Final   Trich, Wet Prep PRESENT (A) NONE SEEN Final   Clue Cells Wet Prep HPF POC PRESENT (A) NONE SEEN Final   WBC, Wet Prep HPF POC MANY (A) NONE SEEN Final   Sperm NONE SEEN  Final  CSF culture     Status: None (Preliminary result)   Collection Time: 02/13/17 12:24 PM  Result Value Ref Range Status   Specimen Description CSF  Final   Special Requests CSF 2CC  Final   Gram Stain NO WBC SEEN NO ORGANISMS SEEN CYTOSPIN SMEAR   Final  Culture   Final    NO GROWTH < 24 HOURS Performed at [facility], [place] [address], [place], [place] [address]    Report Status PENDING  Incomplete     Impression/Recommendation  Active Problems:   Syphilis   ANAELI CORNWALL is a 26 y.o. female with  Possible Neurosyphilis  #1 Possible Neurosyphilis: would like to see what her VDRL CSF is +. Her CSF WBC was only 1 and CSF protein was 17.   Continue IV PCN for now  #2 Risk for HIV and need for PrEP  This patient is at EXTREMELY high risk for subsequent HIV.  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 But given her history of recent and also recurrent STI, in particular syphilis she is at very, very very high risk  I am starting her on Truvada today  I  Am checking HEp B SAG in case we are going to be started on treatment for chronic hep B and PrEP for HIV  I did also send and HIV ultraquant RNA to exclude recent infection (her partner is HIV - but + for chlamydia and syphilis)  #3 Trichomonas: she has received metronidazole, partner should be treated for this as well  #4 Chlamydia: she has received treatment   SHE  WILL NEED TO COME BACK TO [place]  I AM TRYING TO GET HER 30 DAYS OF TRUVADA TO [place]    02/14/2017, 3:30 PM   Thank you so much for this interesting consult  [facility] [place] [phone] (pager) [phone] (office) 02/14/2017, 3:30 PM  Tracy Waller 02/14/2017, 3:30 PM

## 2017-02-15 ENCOUNTER — Ambulatory Visit (HOSPITAL_COMMUNITY): Admit: 2017-02-15 | Payer: Medicaid Other

## 2017-02-15 ENCOUNTER — Telehealth: Payer: Self-pay | Admitting: Pharmacist Clinician (PhC)/ Clinical Pharmacy Specialist

## 2017-02-15 ENCOUNTER — Inpatient Hospital Stay (HOSPITAL_COMMUNITY): Payer: Medicaid Other

## 2017-02-15 LAB — HIV-1 RNA QUANT-NO REFLEX-BLD
HIV 1 RNA Quant: <20 copies/mL
LOG10 HIV-1 RNA: UNDETERMINED {Log_copies}/mL

## 2017-02-15 LAB — BASIC METABOLIC PANEL
Anion gap: 7 (ref 5–15)
BUN: 6 mg/dL (ref 6–20)
CHLORIDE: 107 mmol/L (ref 101–111)
CO2: 24 mmol/L (ref 22–32)
CREATININE: 0.75 mg/dL (ref 0.44–1.00)
Calcium: 9.4 mg/dL (ref 8.9–10.3)
GFR calc Af Amer: >60 mL/min (ref >60)
GFR calc non Af Amer: >60 mL/min (ref >60)
GLUCOSE: 89 mg/dL (ref 65–99)
Potassium: 3.8 mmol/L (ref 3.5–5.1)
SODIUM: 138 mmol/L (ref 135–145)

## 2017-02-15 LAB — HEPATITIS B SURFACE ANTIGEN: HEP B S AG: NEGATIVE

## 2017-02-15 LAB — HCV COMMENT:

## 2017-02-15 LAB — HEPATITIS B SURFACE ANTIBODY, QUANTITATIVE: Hepatitis B-Post: 3.7 m[IU]/mL — ABNORMAL LOW (ref >9.9)

## 2017-02-15 LAB — HEPATITIS A ANTIBODY, TOTAL: HEP A TOTAL AB: NEGATIVE

## 2017-02-15 LAB — HEPATITIS C ANTIBODY (REFLEX): HCV AB: 0.1 {s_co_ratio} (ref 0.0–0.9)

## 2017-02-15 MED ORDER — TRAMADOL HCL 50 MG PO TABS
50.0000 mg | ORAL_TABLET | Freq: Three times a day (TID) | ORAL | Status: DC | PRN
  Administered 2017-02-15: 50 mg via ORAL
  Filled 2017-02-15: qty 1

## 2017-02-15 MED ORDER — LORAZEPAM 2 MG/ML IJ SOLN
1.0000 mg | Freq: Once | INTRAMUSCULAR | Status: AC
  Administered 2017-02-15: 1 mg via INTRAVENOUS
  Filled 2017-02-15: qty 1

## 2017-02-15 MED ORDER — TRAMADOL HCL 50 MG PO TABS: 50.0000 mg | ORAL_TABLET | Freq: Three times a day (TID) | ORAL | 0 refills | Status: DC | PRN

## 2017-02-15 MED ORDER — ACETAMINOPHEN 325 MG PO TABS: 650.0000 mg | ORAL_TABLET | Freq: Four times a day (QID) | ORAL | 0 refills | Status: DC | PRN

## 2017-02-15 MED ORDER — PENICILLIN G BENZATHINE 1200000 UNIT/2ML IM SUSP
2.4000 10*6.[IU] | Freq: Once | INTRAMUSCULAR | Status: AC
  Administered 2017-02-15: 2.4 10*6.[IU] via INTRAMUSCULAR
  Filled 2017-02-15: qty 4

## 2017-02-15 MED ORDER — GADOBENATE DIMEGLUMINE 529 MG/ML IV SOLN
15.0000 mL | Freq: Once | INTRAVENOUS | Status: AC | PRN
  Administered 2017-02-15: 15 mL via INTRAVENOUS

## 2017-02-15 MED ORDER — METRONIDAZOLE 500 MG PO TABS
2000.0000 mg | ORAL_TABLET | Freq: Once | ORAL | Status: AC
  Administered 2017-02-15: 2000 mg via ORAL
  Filled 2017-02-15: qty 4

## 2017-02-15 MED FILL — TRUVADA 200-300 MG TABS: 200-300 | 30 days supply | Qty: 30 | Fill #0

## 2017-02-15 NOTE — Discharge Summary (Addendum)
Physician Discharge Summary  Tracy Waller ZOX:[id number] DOB: 12/17/1990 DOA: 02/13/2017  PCP: Patient, No Pcp Per  Admit date: 02/13/2017 Discharge date: 02/15/2017  Admitted From: Home  Disposition:  Home   Recommendations for Outpatient Follow-up:  1. Follow up with PCP in 1-2 weeks 2. Please obtain BMP/CBC in one week 3. Please follow up on the following pending results: HIV viral load.  4. Further evaluation for headaches.     Discharge Condition: Stable.  CODE STATUS: Full code.  Diet recommendation: Heart Healthy  Brief/Interim Summary: Tracy Waller a 26 y.o.femalewith medical history significant of recently diagnosed with STDs. Treated for chlamydia and trichomonas and found to be positive for syphilis, presented to [facility], for evaluation of positive syphilis antibodies. During ED consult patient reported that she had a ulcer on her mouth 2 months ago and subsequently developed a dark hyperpigmented rash on her feet and chest. She also has developed headaches intermittently that resolved on and recently developing left arm numbness that is on and off. EDP consulted infectious disease physician recommended LP to rule out neurogenic syphilis. Patient denies neck stiffness, nausea, vomiting, blurred vision or photophobia.  Syphilis; RPR positive/ Secondary syphilis.  LP was done at [facility] LP pressure 34, so far analysis negative - VDRL CSF negative. Less likely neurosyphilis.  Has plantar rash.  Treated initially with High dose IV penicillin.  ID consulted.  HIV RNA pending. Started on truvada preventive.  She will received one dose of IM penicillin prior to didscharge  Normocytic anemia; Hb stable.   Chlamydia and trichomonas treated per HPI report.  Per ID note she received ceftriaxone and azithro.  She will received 2 gr of flagyl prior to discharge.   Hypokalemia; replete.   Headaches;  MRI negative.  CSF negative VDRL.  Discussed MRI  results with patient. Advised her to follow up with opthalmology for evaluation of papilledema. In case of IHC.  Will provide couple days of tramadol, would not want to use NSAID with Truvada. .   Discharge Diagnoses:  Active Problems:   Syphilis   Neurosyphilis   Trichomonas vaginalis (TV) infection   Chlamydia   Chlamydia contact   Encounter for HIV counseling   Problems related to high-risk sexual behavior    Discharge Instructions  Discharge Instructions    Diet - low sodium heart healthy    Complete by:  As directed    Increase activity slowly    Complete by:  As directed      Allergies as of 02/15/2017   No Known Allergies     Medication List    STOP taking these medications   doxycycline 100 MG capsule Commonly known as:  VIBRAMYCIN   metroNIDAZOLE 0.75 % vaginal gel Commonly known as:  METROGEL VAGINAL   metroNIDAZOLE 500 MG tablet Commonly known as:  FLAGYL     TAKE these medications   acetaminophen 325 MG tablet Commonly known as:  TYLENOL Take 2 tablets (650 mg total) by mouth every 6 (six) hours as needed for mild pain (or Fever >/= 101).   emtricitabine-tenofovir 200-300 MG tablet Commonly known as:  TRUVADA Take 1 tablet by mouth daily.   traMADol 50 MG tablet Commonly known as:  ULTRAM Take 1 tablet (50 mg total) by mouth every 8 (eight) hours as needed for moderate pain.      Follow-up Information    Daiva Eves, Lisette Grinder, MD Follow up in 1 week(s).   Specialty:  Infectious Diseases Contact information:  [address] [place] [address] [phone]          No Known Allergies  Consultations:  ID   Procedures/Studies: Ct Head Wo Contrast  Result Date: 02/13/2017 CLINICAL DATA:  Positive syphilis assessed and headaches EXAM: CT HEAD WITHOUT CONTRAST TECHNIQUE: Contiguous axial images were obtained from the base of the skull through the vertex without intravenous contrast. COMPARISON:  None. FINDINGS: Brain: No  evidence of acute infarction, hemorrhage, hydrocephalus, extra-axial collection or mass lesion/mass effect. Vascular: No hyperdense vessel or unexpected calcification. Skull: Normal. Negative for fracture or focal lesion. Sinuses/Orbits: No acute finding. Other: None. IMPRESSION: No acute abnormality noted. Electronically Signed   By: Alcide CleverMark  Lukens M.D.   On: 02/13/2017 10:56   Mr Laqueta JeanBrain W BJWo Contrast  Result Date: 02/15/2017 CLINICAL DATA:  Elevated intracranial pressure. Headache. Evaluate for neurosyphilis. EXAM: MRI HEAD WITHOUT AND WITH CONTRAST TECHNIQUE: Multiplanar, multiecho pulse sequences of the brain and surrounding structures were obtained without and with intravenous contrast. CONTRAST:  15mL MULTIHANCE GADOBENATE DIMEGLUMINE 529 MG/ML IV SOLN COMPARISON:  None. FINDINGS: Brain: Normal appearance of the brain. No leptomeningeal, parenchymal or cranial nerve enhancement. No hydrocephalus, mass, or infarct. No white matter disease or atrophy. Vascular: Major flow voids are preserved. Skull and upper cervical spine: Negative for marrow lesion Sinuses/Orbits: Normal Other: Mildly intermittently motion degraded. IMPRESSION: Normal brain MRI. Electronically Signed   By: Marnee SpringJonathon  Watts M.D.   On: 02/15/2017 12:04       Subjective: She has been walking in the hall. No new complaints.   Discharge Exam: Vitals:   02/14/17 2126 02/15/17 0619  BP: 140/90 113/61  Pulse: (!) 55   Resp: 18 18  Temp: 97.8 F (36.6 C) 97.6 F (36.4 C)   Vitals:   02/14/17 0602 02/14/17 1419 02/14/17 2126 02/15/17 0619  BP: 116/70 (!) 145/67 140/90 113/61  Pulse: 70 (!) 57 (!) 55   Resp: 17 18 18 18   Temp: 98.2 F (36.8 C) 98.6 F (37 C) 97.8 F (36.6 C) 97.6 F (36.4 C)  TempSrc: Oral Oral Oral Oral  SpO2: 100% 100% 100% 100%  Weight:      Height:        General: Pt is alert, awake, not in acute distress Cardiovascular: RRR, S1/S2 +, no rubs, no gallops Respiratory: CTA bilaterally, no wheezing,  no rhonchi Abdominal: Soft, NT, ND, bowel sounds + Extremities: no edema, no cyanosis    The results of significant diagnostics from this hospitalization (including imaging, microbiology, ancillary and laboratory) are listed below for reference.     Microbiology: Recent Results (from the past 240 hour(s))  CSF culture     Status: None (Preliminary result)   Collection Time: 02/13/17 12:24 PM  Result Value Ref Range Status   Specimen Description CSF  Final   Special Requests CSF 2CC  Final   Gram Stain NO WBC SEEN NO ORGANISMS SEEN CYTOSPIN SMEAR   Final   Culture   Final    NO GROWTH 2 DAYS Performed at [facility], [address] [place], [place] [address]    Report Status PENDING  Incomplete     Labs: BNP (last 3 results) No results for input(s): BNP in the last 8760 hours. Basic Metabolic Panel:  Recent Labs Lab 02/13/17 1057 02/14/17 0713 02/15/17 0854  NA 137 137 138  K 3.6 3.4* 3.8  CL 106 107 107  CO2 26 24 24   GLUCOSE 85 116* 89  BUN 11 11 6  CREATININE 0.67 0.75 0.75  CALCIUM 9.2 8.7* 9.4   Liver Function Tests:  Recent Labs Lab 02/13/17 1057 02/14/17 0713  AST 24 24  ALT 18 17  ALKPHOS 68 62  BILITOT 0.2* 0.7  PROT 8.1 7.3  ALBUMIN 4.0 3.9   No results for input(s): LIPASE, AMYLASE in the last 168 hours. No results for input(s): AMMONIA in the last 168 hours. CBC:  Recent Labs Lab 02/13/17 1057 02/14/17 0713  WBC 6.2 6.0  NEUTROABS 2.9  --   HGB 11.7* 11.4*  HCT 35.5* 36.5  MCV 80.1 81.1  PLT 268 263   Cardiac Enzymes: No results for input(s): CKTOTAL, CKMB, CKMBINDEX, TROPONINI in the last 168 hours. BNP: Invalid input(s): POCBNP CBG: No results for input(s): GLUCAP in the last 168 hours. D-Dimer No results for input(s): DDIMER in the last 72 hours. Hgb A1c No results for input(s): HGBA1C in the last 72 hours. Lipid Profile No results for input(s): CHOL, HDL, LDLCALC, TRIG, CHOLHDL, LDLDIRECT in the last 72  hours. Thyroid function studies No results for input(s): TSH, T4TOTAL, T3FREE, THYROIDAB in the last 72 hours.  Invalid input(s): FREET3 Anemia work up No results for input(s): VITAMINB12, FOLATE, FERRITIN, TIBC, IRON, RETICCTPCT in the last 72 hours. Urinalysis    Component Value Date/Time   COLORURINE YELLOW 02/05/2017 0111   APPEARANCEUR CLOUDY (A) 02/05/2017 0111   LABSPEC 1.023 02/05/2017 0111   PHURINE 7.0 02/05/2017 0111   GLUCOSEU NEGATIVE 02/05/2017 0111   HGBUR NEGATIVE 02/05/2017 0111   BILIRUBINUR NEGATIVE 02/05/2017 0111   BILIRUBINUR NEGATIVE 06/03/2013 1104   KETONESUR NEGATIVE 02/05/2017 0111   PROTEINUR NEGATIVE 02/05/2017 0111   UROBILINOGEN 1.0 05/31/2014 1920   NITRITE NEGATIVE 02/05/2017 0111   LEUKOCYTESUR SMALL (A) 02/05/2017 0111   Sepsis Labs Invalid input(s): PROCALCITONIN,  WBC,  LACTICIDVEN Microbiology Recent Results (from the past 240 hour(s))  CSF culture     Status: None (Preliminary result)   Collection Time: 02/13/17 12:24 PM  Result Value Ref Range Status   Specimen Description CSF  Final   Special Requests CSF 2CC  Final   Gram Stain NO WBC SEEN NO ORGANISMS SEEN CYTOSPIN SMEAR   Final   Culture   Final    NO GROWTH 2 DAYS Performed at [facility], [address] [place], [place] [address]    Report Status PENDING  Incomplete     Time coordinating discharge: Over 30 minutes  SIGNED:   Alba Cory, MD  [facility] 02/15/2017, 12:18 PM Pager [phone]  If 7PM-7AM, please contact night-coverage [email or web address] Password [email or web address]

## 2017-02-15 NOTE — Telephone Encounter (Signed)
Thanks MInh you are stellar

## 2017-02-15 NOTE — Progress Notes (Signed)
Pt asked about going outside to walk. Patient was informed by RN and NT that she could walk but she had to stay on this unit and that it was a liability to walk off unit with IV pole. Pt and significant other walked off unit still. Security was called to help look for patient.

## 2017-02-15 NOTE — Progress Notes (Signed)
Date: Feb 15, 2017 Discharge orders review for case management needs.  Information to call the [facility] given to patient. Marcelle Smilinghonda Davis, BSN, RN3, CCM: [phone]

## 2017-02-15 NOTE — Progress Notes (Addendum)
Pt and significant other are now back on the unit at this time. Pt was probably gone for about 15 minutes off unit

## 2017-02-15 NOTE — Progress Notes (Signed)
Subjective: No new complaints   Antibiotics:  Anti-infectives    Start     Dose/Rate Route Frequency Ordered Stop   02/15/17 1400  penicillin g benzathine (BICILLIN LA) 1200000 UNIT/2ML injection 2.4 Million Units     2.4 Million Units Intramuscular  Once 02/15/17 1230 02/15/17 1344   02/15/17 1000  metroNIDAZOLE (FLAGYL) tablet 2,000 mg     2,000 mg Oral  Once 02/15/17 0904 02/15/17 1018   02/14/17 2200  metroNIDAZOLE (FLAGYL) tablet 500 mg  Status:  Discontinued    Comments:  One po bid x 7 days     500 mg Oral 2 times daily 02/14/17 1618 02/15/17 0904   02/14/17 1600  emtricitabine-tenofovir (TRUVADA) 200-300 MG per tablet 1 tablet  Status:  Discontinued     1 tablet Oral Daily 02/14/17 1446 02/15/17 1702   02/13/17 1800  penicillin G potassium 4 Million Units in dextrose 5 % 250 mL IVPB  Status:  Discontinued     4 Million Units 250 mL/hr over 60 Minutes Intravenous Every 4 hours 02/13/17 1725 02/15/17 1230      Medications: Scheduled Meds: Continuous Infusions: PRN Meds:.    Objective: Weight change:   Intake/Output Summary (Last 24 hours) at 02/15/17 1807 Last data filed at 02/15/17 0830  Gross per 24 hour  Intake              240 ml  Output                0 ml  Net              240 ml   Blood pressure 113/61, pulse (!) 55, temperature 97.6 F (36.4 C), temperature source Oral, resp. rate 18, height 5\' 3"  (1.6 m), weight 167 lb (75.8 kg), last menstrual period 02/05/2017, SpO2 100 %. Temp:  [97.6 F (36.4 C)-97.8 F (36.6 C)] 97.6 F (36.4 C) (05/24 0619) Pulse Rate:  [55] 55 (05/23 2126) Resp:  [18] 18 (05/24 0619) BP: (113-140)/(61-90) 113/61 (05/24 0619) SpO2:  [100 %] 100 % (05/24 [id number])  Physical Exam: General: Alert and awake, oriented x3, eating food  Neuro: nonfocal  CBC: CBC Latest Ref Rng & Units 02/14/2017 02/13/2017 05/31/2014  WBC 4.0 - 10.5 K/uL 6.0 6.2 7.5  Hemoglobin 12.0 - 15.0 g/dL 11.4(L) 11.7(L) 11.1(L)  Hematocrit 36.0 -  46.0 % 36.5 35.5(L) 33.8(L)  Platelets 150 - 400 K/uL 263 268 281     BMET  Recent Labs  02/14/17 0713 02/15/17 0854  NA 137 138  K 3.4* 3.8  CL 107 107  CO2 24 24  GLUCOSE 116* 89  BUN 11 6  CREATININE 0.75 0.75  CALCIUM 8.7* 9.4     Liver Panel   Recent Labs  02/13/17 1057 02/14/17 0713  PROT 8.1 7.3  ALBUMIN 4.0 3.9  AST 24 24  ALT 18 17  ALKPHOS 68 62  BILITOT 0.2* 0.7       Sedimentation Rate No results for input(s): ESRSEDRATE in the last 72 hours. C-Reactive Protein No results for input(s): CRP in the last 72 hours.  Micro Results: Recent Results (from the past 720 hour(s))  Wet prep, genital     Status: Abnormal   Collection Time: 02/05/17  1:19 AM  Result Value Ref Range Status   Yeast Wet Prep HPF POC NONE SEEN NONE SEEN Final   Trich, Wet Prep PRESENT (A) NONE SEEN Final   Clue Cells Wet Prep HPF POC  PRESENT (A) NONE SEEN Final   WBC, Wet Prep HPF POC MANY (A) NONE SEEN Final   Sperm NONE SEEN  Final  CSF culture     Status: None (Preliminary result)   Collection Time: 02/13/17 12:24 PM  Result Value Ref Range Status   Specimen Description CSF  Final   Special Requests CSF 2CC  Final   Gram Stain NO WBC SEEN NO ORGANISMS SEEN CYTOSPIN SMEAR   Final   Culture   Final    NO GROWTH 2 DAYS Performed at [facility], [address], [place], [place] [address]    Report Status PENDING  Incomplete    Studies/Results: Mr Laqueta Jean UE Contrast  Result Date: 02/15/2017 CLINICAL DATA:  Elevated intracranial pressure. Headache. Evaluate for neurosyphilis. EXAM: MRI HEAD WITHOUT AND WITH CONTRAST TECHNIQUE: Multiplanar, multiecho pulse sequences of the brain and surrounding structures were obtained without and with intravenous contrast. CONTRAST:  15mL MULTIHANCE GADOBENATE DIMEGLUMINE 529 MG/ML IV SOLN COMPARISON:  None. FINDINGS: Brain: Normal appearance of the brain. No leptomeningeal, parenchymal or cranial nerve enhancement. No  hydrocephalus, mass, or infarct. No white matter disease or atrophy. Vascular: Major flow voids are preserved. Skull and upper cervical spine: Negative for marrow lesion Sinuses/Orbits: Normal Other: Mildly intermittently motion degraded. IMPRESSION: Normal brain MRI. Electronically Signed   By: Marnee Spring M.D.   On: 02/15/2017 12:04      Assessment/Plan:  INTERVAL HISTORY:  VDRL CSF negative   Active Problems:   Syphilis   Neurosyphilis   Trichomonas vaginalis (TV) infection   Chlamydia   Chlamydia contact   Encounter for HIV counseling   Problems related to high-risk sexual behavior    Tracy Waller is a 26 y.o. female with mx episodes of chlamydia, now here with syphilis (secondary) but with concern for Neurosyphilis. She also has trichomonas  #1 Rule out Neurosyphilis:  VDRL -, CSF WBC nml, Protein nml = ruled out  #2 Headache with high ICP: ordered MRI brain = normal. Would defer to Neurology workup for this and HA as outpatient  #3 Trichomonas: 2 grams flagyl and no etoh x 48 hours  #4 HIV prevention: Truvada started and #30 day supply given  She needs to followup with Tracy Waller and/or Tracy [facility]er in our [facility].  Her boyfriend should see if he might be eligible for our HPTN 083 study. Otherwise he could get PrEP from [facility], [facility] and hopefully at [facility] (without having to pay for visits and labs, we are still working that out)     LOS: 2 days   Acey Lav 02/15/2017, 6:07 PM

## 2017-02-15 NOTE — Progress Notes (Signed)
[facility] set up for transportation for MRI at [facility] to be there at 4pm today

## 2017-02-15 NOTE — Telephone Encounter (Signed)
First month of TRV for PreP was delivered to her before discharge. Spent a good 20 mins counseling her and her boyfriend at bedside about HIV PreP. This is a one month supply only. She is going to f/u with us for ongoing PreP    Talked to her boyfriend about PreP also. He is uninsured. Talked to him about the future PreP program at the health department.

## 2017-02-16 LAB — CSF CULTURE: CULTURE: NO GROWTH

## 2017-02-16 LAB — CSF CULTURE W GRAM STAIN: Gram Stain: NONE SEEN

## 2017-03-12 ENCOUNTER — Ambulatory Visit: Payer: Medicaid Other

## 2017-12-17 ENCOUNTER — Encounter (HOSPITAL_BASED_OUTPATIENT_CLINIC_OR_DEPARTMENT_OTHER): Payer: Self-pay | Admitting: *Deleted

## 2017-12-17 ENCOUNTER — Other Ambulatory Visit: Payer: Self-pay

## 2017-12-17 DIAGNOSIS — N765 Ulceration of vagina: Secondary | ICD-10-CM | POA: Diagnosis not present

## 2017-12-17 DIAGNOSIS — Z5321 Procedure and treatment not carried out due to patient leaving prior to being seen by health care provider: Secondary | ICD-10-CM | POA: Insufficient documentation

## 2017-12-17 LAB — PREGNANCY, URINE: Preg Test, Ur: NEGATIVE

## 2017-12-17 LAB — URINALYSIS, ROUTINE W REFLEX MICROSCOPIC
Bilirubin Urine: NEGATIVE
GLUCOSE, UA: NEGATIVE mg/dL
Hgb urine dipstick: NEGATIVE
Ketones, ur: NEGATIVE mg/dL
LEUKOCYTES UA: NEGATIVE
Nitrite: NEGATIVE
PH: 6 (ref 5.0–8.0)
Protein, ur: NEGATIVE mg/dL
SPECIFIC GRAVITY, URINE: 1.025 (ref 1.005–1.030)

## 2017-12-17 NOTE — ED Triage Notes (Signed)
Blisters on her vagina. States she thinks she has syphilis.

## 2017-12-18 ENCOUNTER — Emergency Department (HOSPITAL_BASED_OUTPATIENT_CLINIC_OR_DEPARTMENT_OTHER)
Admission: EM | Admit: 2017-12-18 | Discharge: 2017-12-18 | Disposition: A | Discharge status: Home / Self Care | Payer: Medicaid Other | Attending: Emergency Medicine | Admitting: Emergency Medicine

## 2019-10-23 ENCOUNTER — Ambulatory Visit: Payer: Medicaid Other | Admitting: Obstetrics

## 2019-11-12 ENCOUNTER — Ambulatory Visit (INDEPENDENT_AMBULATORY_CARE_PROVIDER_SITE_OTHER): Payer: Medicaid Other | Admitting: Obstetrics

## 2019-11-12 ENCOUNTER — Other Ambulatory Visit: Payer: Self-pay

## 2019-11-12 ENCOUNTER — Encounter: Payer: Self-pay | Admitting: Obstetrics

## 2019-11-12 ENCOUNTER — Other Ambulatory Visit (HOSPITAL_COMMUNITY)
Admission: RE | Admit: 2019-11-12 | Discharge: 2019-11-12 | Disposition: A | Discharge status: Home / Self Care | Payer: Medicaid Other | Source: Ambulatory Visit | Attending: Obstetrics | Admitting: Obstetrics

## 2019-11-12 VITALS — BP 136/85 | HR 78 | Ht 63.0 in | Wt 175.0 lb

## 2019-11-12 DIAGNOSIS — Z3202 Encounter for pregnancy test, result negative: Secondary | ICD-10-CM

## 2019-11-12 DIAGNOSIS — B369 Superficial mycosis, unspecified: Secondary | ICD-10-CM

## 2019-11-12 DIAGNOSIS — Z01419 Encounter for gynecological examination (general) (routine) without abnormal findings: Secondary | ICD-10-CM

## 2019-11-12 DIAGNOSIS — Z113 Encounter for screening for infections with a predominantly sexual mode of transmission: Secondary | ICD-10-CM

## 2019-11-12 DIAGNOSIS — F17200 Nicotine dependence, unspecified, uncomplicated: Secondary | ICD-10-CM

## 2019-11-12 DIAGNOSIS — N898 Other specified noninflammatory disorders of vagina: Secondary | ICD-10-CM | POA: Diagnosis present

## 2019-11-12 DIAGNOSIS — Z0000 Encounter for general adult medical examination without abnormal findings: Secondary | ICD-10-CM

## 2019-11-12 DIAGNOSIS — N912 Amenorrhea, unspecified: Secondary | ICD-10-CM

## 2019-11-12 LAB — POCT URINE PREGNANCY: Preg Test, Ur: NEGATIVE

## 2019-11-12 MED ORDER — CLOTRIMAZOLE 1 % EX CREA: 1.0000 "application " | TOPICAL_CREAM | Freq: Two times a day (BID) | CUTANEOUS | 1 refills | Status: DC

## 2019-11-12 NOTE — Progress Notes (Signed)
Subjective:        Tracy Waller is a 29 y.o. female here for a routine exam.  Current complaints: rash on neck area.  Personal health questionnaire:  Is patient Ashkenazi Jewish, have a family history of breast and/or ovarian cancer: no Is there a family history of uterine cancer diagnosed at age < 82, gastrointestinal cancer, urinary tract cancer, family member who is a Field seismologist syndrome-associated carrier: no Is the patient overweight and hypertensive, family history of diabetes, personal history of gestational diabetes, preeclampsia or PCOS: no Is patient over 51, have PCOS,  family history of premature CHD under age 77, diabetes, smoke, have hypertension or peripheral artery disease:  no At any time, has a partner hit, kicked or otherwise hurt or frightened you?: no Over the past 2 weeks, have you felt down, depressed or hopeless?: no Over the past 2 weeks, have you felt little interest or pleasure in doing things?:no   Gynecologic History Patient's last menstrual period was 08/18/2019. Contraception: none Last Pap: unknown. Results were: unknown Last mammogram: n/a. Results were: n/a  Obstetric History OB History  Gravida Para Term Preterm AB Living  3 3 3     3   SAB TAB Ectopic Multiple Live Births          1    # Outcome Date GA Lbr Len/2nd Weight Sex Delivery Anes PTL Lv  3 Term 06/06/13 [redacted]w[redacted]d 19:30 / 00:48 7 lb 11.2 oz (3.493 kg) F VBAC EPI  LIV  2 Term           1 Term             Past Medical History:  Diagnosis Date  . Hx of chlamydia infection 2017  . Hx of trichomoniasis 2017  . Syphilis     Past Surgical History:  Procedure Laterality Date  . CESAREAN SECTION      No current outpatient medications on file. No Known Allergies  Social History   Tobacco Use  . Smoking status: Current Every Day Smoker    Types: Cigarettes  . Smokeless tobacco: Never Used  Substance Use Topics  . Alcohol use: No    Comment: occasionally    Family History   Problem Relation Age of Onset  . Asthma Father   . Hypertension Father   . Hypertension Mother   . Hypertension Sister       Review of Systems  Constitutional: negative for fatigue and weight loss Respiratory: negative for cough and wheezing Cardiovascular: negative for chest pain, fatigue and palpitations Gastrointestinal: negative for abdominal pain and change in bowel habits Musculoskeletal:negative for myalgias Neurological: negative for gait problems and tremors Behavioral/Psych: negative for abusive relationship, depression Endocrine: negative for temperature intolerance    Genitourinary:negative for abnormal menstrual periods, genital lesions, hot flashes, sexual problems and vaginal discharge Integument/breast: negative for breast lump, breast tenderness, nipple discharge and skin lesion(s)    Objective:       BP 136/85   Pulse 78   Ht 5\' 3"  (1.6 m)   Wt 175 lb (79.4 kg)   LMP 08/18/2019   BMI 31.00 kg/m  General:   alert  Skin:   no rash or abnormalities  Lungs:   clear to auscultation bilaterally  Heart:   regular rate and rhythm, S1, S2 normal, no murmur, click, rub or gallop  Breasts:   normal without suspicious masses, skin or nipple changes or axillary nodes  Abdomen:  normal findings: no organomegaly, soft, non-tender and  no hernia  Pelvis:  External genitalia: normal general appearance Urinary system: urethral meatus normal and bladder without fullness, nontender Vaginal: normal without tenderness, induration or masses Cervix: normal appearance Adnexa: normal bimanual exam Uterus: anteverted and non-tender, normal size   Lab Review Urine pregnancy test Labs reviewed yes Radiologic studies reviewed no  50% of 25 min visit spent on counseling and coordination of care.   Assessment:     1. Encounter for routine gynecological examination with Papanicolaou smear of cervix Rx: - TSH - Comprehensive metabolic panel - Cytology - PAP( [facility])  2. Amenorrhea Rx: - POCT urine pregnancy  3. Vaginal discharge Rx: - Cervicovaginal ancillary only  4. Screen for STD (sexually transmitted disease) Rx: - HIV Antibody (routine testing w rflx) - Hepatitis B surface antigen - RPR - Hepatitis C antibody  5. Routine adult health maintenance Rx: - Ambulatory referral to Internal Medicine  6. Superficial fungus infection of skin Rx: - clotrimazole (LOTRIMIN) 1 % cream; Apply 1 application topically 2 (two) times daily.  Dispense: 60 g; Refill: 1  7. Tobacco dependence - cessation with the aid of medication and behavioral modification recommended     Plan:    Education reviewed: calcium supplements, depression evaluation, low fat, low cholesterol diet, safe sex/STD prevention, self breast exams, smoking cessation and weight bearing exercise. Contraception: none. Follow up in: 1 year.   No orders of the defined types were placed in this encounter.  Orders Placed This Encounter  Procedures  . HIV Antibody (routine testing w rflx)  . Hepatitis B surface antigen  . RPR  . Hepatitis C antibody  . TSH  . Comprehensive metabolic panel  . CBC  . Ferritin  . Ambulatory referral to Internal Medicine    Referral Priority:   Routine    Referral Type:   Consultation    Referral Reason:   Specialty Services Required    Requested Specialty:   Internal Medicine    Number of Visits Requested:   1  . POCT urine pregnancy    Assciate with Z32.02 (negative pregnancy test). If positive, switch to Z32.01 (positive pregnancy test)    Brock Bad, MD 11/12/2019 10:10 AM

## 2019-11-13 LAB — COMPREHENSIVE METABOLIC PANEL
ALT: 13 IU/L (ref 0–32)
AST: 17 IU/L (ref 0–40)
Albumin/Globulin Ratio: 1.6 (ref 1.2–2.2)
Albumin: 4.6 g/dL (ref 3.9–5.0)
Alkaline Phosphatase: 84 IU/L (ref 39–117)
BUN/Creatinine Ratio: 13 (ref 9–23)
BUN: 12 mg/dL (ref 6–20)
Bilirubin Total: 0.5 mg/dL (ref 0.0–1.2)
CO2: 24 mmol/L (ref 20–29)
Calcium: 9.3 mg/dL (ref 8.7–10.2)
Chloride: 106 mmol/L (ref 96–106)
Creatinine, Ser: 0.94 mg/dL (ref 0.57–1.00)
GFR calc Af Amer: 95 mL/min/{1.73_m2} (ref >59)
GFR calc non Af Amer: 83 mL/min/{1.73_m2} (ref >59)
Globulin, Total: 2.9 g/dL (ref 1.5–4.5)
Glucose: 76 mg/dL (ref 65–99)
Potassium: 4 mmol/L (ref 3.5–5.2)
Sodium: 143 mmol/L (ref 134–144)
Total Protein: 7.5 g/dL (ref 6.0–8.5)

## 2019-11-13 LAB — CERVICOVAGINAL ANCILLARY ONLY
Bacterial Vaginitis (gardnerella): NEGATIVE
Candida Glabrata: NEGATIVE
Candida Vaginitis: NEGATIVE
Chlamydia: POSITIVE — AB
Comment: NEGATIVE
Comment: NEGATIVE
Comment: NEGATIVE
Comment: NEGATIVE
Comment: NEGATIVE
Comment: NORMAL
Neisseria Gonorrhea: NEGATIVE
Trichomonas: POSITIVE — AB

## 2019-11-13 LAB — TSH: TSH: 1.05 u[IU]/mL (ref 0.450–4.500)

## 2019-11-14 ENCOUNTER — Telehealth: Payer: Self-pay

## 2019-11-14 ENCOUNTER — Other Ambulatory Visit: Payer: Self-pay | Admitting: Obstetrics

## 2019-11-14 DIAGNOSIS — A749 Chlamydial infection, unspecified: Secondary | ICD-10-CM

## 2019-11-14 DIAGNOSIS — N760 Acute vaginitis: Secondary | ICD-10-CM

## 2019-11-14 DIAGNOSIS — B9689 Other specified bacterial agents as the cause of diseases classified elsewhere: Secondary | ICD-10-CM

## 2019-11-14 LAB — RPR, QUANT+TP ABS (REFLEX)
Rapid Plasma Reagin, Quant: 1:1 {titer} — ABNORMAL HIGH
T Pallidum Abs: REACTIVE — AB

## 2019-11-14 LAB — CYTOLOGY - PAP: Diagnosis: NEGATIVE

## 2019-11-14 LAB — RPR: RPR Ser Ql: REACTIVE — AB

## 2019-11-14 LAB — HIV ANTIBODY (ROUTINE TESTING W REFLEX): HIV Screen 4th Generation wRfx: NONREACTIVE

## 2019-11-14 LAB — HEPATITIS B SURFACE ANTIGEN: Hepatitis B Surface Ag: NEGATIVE

## 2019-11-14 LAB — HEPATITIS C ANTIBODY: Hep C Virus Ab: <0.1 s/co ratio (ref 0.0–0.9)

## 2019-11-14 MED ORDER — METRONIDAZOLE 500 MG PO TABS: 500.0000 mg | ORAL_TABLET | Freq: Two times a day (BID) | ORAL | 2 refills | Status: DC

## 2019-11-14 MED ORDER — AZITHROMYCIN 500 MG PO TABS: 1000.0000 mg | ORAL_TABLET | Freq: Once | ORAL | 0 refills | Status: AC

## 2019-11-14 NOTE — Telephone Encounter (Signed)
-----   Message from Brock Bad, MD sent at 11/14/2019 10:02 AM EST ----- Azithromycin Rx for Chlamydia Flagyl Rx for Trichomonas

## 2019-11-14 NOTE — Telephone Encounter (Signed)
TC to pt to make aware of results. Pt not ava no vm is set up pt not on MyChart.

## 2019-12-25 ENCOUNTER — Ambulatory Visit (INDEPENDENT_AMBULATORY_CARE_PROVIDER_SITE_OTHER): Payer: Medicaid Other | Admitting: Internal Medicine

## 2019-12-25 ENCOUNTER — Encounter: Payer: Self-pay | Admitting: Internal Medicine

## 2019-12-25 DIAGNOSIS — Z7689 Persons encountering health services in other specified circumstances: Secondary | ICD-10-CM

## 2019-12-25 DIAGNOSIS — N912 Amenorrhea, unspecified: Secondary | ICD-10-CM

## 2019-12-25 NOTE — Progress Notes (Signed)
Virtual Visit via Telephone Note  I connected with Tracy Waller, on 12/25/2019 at 9:08 AM by telephone due to the COVID-19 pandemic and verified that I am speaking with the correct person using two identifiers.   Consent: I discussed the limitations, risks, security and privacy concerns of performing an evaluation and management service by telephone and the availability of in person appointments. I also discussed with the patient that there may be a patient responsible charge related to this service. The patient expressed understanding and agreed to proceed.   Location of Patient: Home   Location of Provider: Clinic    Persons participating in Telemedicine visit: Tracy Waller [facility] Dr. Earlene Plater      History of Present Illness: Patient has a visit to establish care.   Amenorrhea: LMP was around early Dec. Reports multiple negative pregnancy tests as recently as last month. Prior to this, her menstrual cycles were regular. No contraception. She endorses headaches. Denies excessive acne and hirsutism. No OTC medications or prescription medications.     Past Medical History:  Diagnosis Date  . Hx of chlamydia infection 2017  . Hx of trichomoniasis 2017  . Syphilis    No Known Allergies  No current outpatient medications on file prior to visit.   No current facility-administered medications on file prior to visit.    Observations/Objective: NAD. Speaking clearly.  Work of breathing normal.  Alert and oriented. Mood appropriate.   Assessment and Plan: 1. Encounter to establish care  2. Amenorrhea Patient seen by OBGYN in Feb. Had normal pelvic exam at that time. TSH was within normal limits. Will proceed with further work up given amenorrhea with previous regular menses >3 months. Noted that patient's BMI >30, making PCOS higher on differential.  - FSH/LH; Future - Prolactin; Future - Estradiol; Future - Testosterone; Future - Beta hCG quant (ref lab);  Future   Follow Up Instructions: Lab visit on 4/5    I discussed the assessment and treatment plan with the patient. The patient was provided an opportunity to ask questions and all were answered. The patient agreed with the plan and demonstrated an understanding of the instructions.   The patient was advised to call back or seek an in-person evaluation if the symptoms worsen or if the condition fails to improve as anticipated.     I provided 14 minutes total of non-face-to-face time during this encounter including median intraservice time, reviewing previous notes, investigations, ordering medications, medical decision making, coordinating care and patient verbalized understanding at the end of the visit.    Marcy Siren, D.O. Primary Care at [facility]  12/25/2019, 9:08 AM

## 2019-12-29 ENCOUNTER — Other Ambulatory Visit: Payer: Medicaid Other

## 2020-02-05 ENCOUNTER — Ambulatory Visit: Payer: Medicaid Other

## 2020-02-06 ENCOUNTER — Other Ambulatory Visit: Payer: Self-pay

## 2020-02-06 ENCOUNTER — Other Ambulatory Visit (HOSPITAL_COMMUNITY)
Admission: RE | Admit: 2020-02-06 | Discharge: 2020-02-06 | Disposition: A | Discharge status: Home / Self Care | Payer: Medicaid Other | Source: Ambulatory Visit | Attending: Obstetrics and Gynecology | Admitting: Obstetrics and Gynecology

## 2020-02-06 ENCOUNTER — Ambulatory Visit (INDEPENDENT_AMBULATORY_CARE_PROVIDER_SITE_OTHER): Payer: Medicaid Other

## 2020-02-06 DIAGNOSIS — N898 Other specified noninflammatory disorders of vagina: Secondary | ICD-10-CM | POA: Diagnosis not present

## 2020-02-06 NOTE — Progress Notes (Signed)
Tracy Waller is here for STD screening. Pt stated that she heard her partner may have something.  She do have vaginal discharge but she believes that is normal. Results pending. -EH/RMA

## 2020-02-06 NOTE — Progress Notes (Signed)
I have reviewed this chart and agree with the RN/CMA assessment and management.    K. Meryl Mikai Meints, M.D. Attending [facility] (Faculty [facility])   

## 2020-02-09 LAB — CERVICOVAGINAL ANCILLARY ONLY
Bacterial Vaginitis (gardnerella): NEGATIVE
Candida Glabrata: NEGATIVE
Candida Vaginitis: NEGATIVE
Chlamydia: NEGATIVE
Comment: NEGATIVE
Comment: NEGATIVE
Comment: NEGATIVE
Comment: NEGATIVE
Comment: NEGATIVE
Comment: NORMAL
Neisseria Gonorrhea: POSITIVE — AB
Trichomonas: POSITIVE — AB

## 2020-02-10 ENCOUNTER — Other Ambulatory Visit: Payer: Self-pay | Admitting: Obstetrics and Gynecology

## 2020-02-10 ENCOUNTER — Telehealth: Payer: Self-pay

## 2020-02-10 MED ORDER — METRONIDAZOLE 500 MG PO TABS: ORAL_TABLET | ORAL | 0 refills | Status: DC

## 2020-02-10 NOTE — Telephone Encounter (Signed)
S/w pt and advised of results, rx sent and transferred to scheduler for rocephin appt. Form faxed to [facility]

## 2020-02-11 ENCOUNTER — Ambulatory Visit (INDEPENDENT_AMBULATORY_CARE_PROVIDER_SITE_OTHER): Payer: Medicaid Other

## 2020-02-11 ENCOUNTER — Other Ambulatory Visit: Payer: Self-pay

## 2020-02-11 VITALS — BP 124/87 | HR 83 | Wt 155.0 lb

## 2020-02-11 DIAGNOSIS — A549 Gonococcal infection, unspecified: Secondary | ICD-10-CM | POA: Diagnosis not present

## 2020-02-11 MED ORDER — CEFTRIAXONE SODIUM 500 MG IJ SOLR
500.0000 mg | Freq: Once | INTRAMUSCULAR | Status: AC
  Administered 2020-02-11: 500 mg via INTRAMUSCULAR

## 2020-02-11 NOTE — Progress Notes (Addendum)
SUBJECTIVE GYN presents for Rocephin Injection for +Gonorrhea.  Injection given in LUOQ, tolerated well.   PLAN 500 mg Rocephin given.  Patient advised that her partner needs to be treated and to not have any intercourse for the next two weeks after completing treatment.  Administrations This Visit    cefTRIAXone (ROCEPHIN) injection 500 mg    Admin Date 02/11/2020 Action Given Dose 500 mg Route Intramuscular Administered By Maretta Bees, RMA

## 2020-02-11 NOTE — Progress Notes (Signed)
I have reviewed this chart and agree with the RN/CMA assessment and management.    K. Meryl Denisa Enterline, M.D. Attending [facility])   

## 2020-02-12 LAB — RPR, QUANT+TP ABS (REFLEX)
Rapid Plasma Reagin, Quant: 1:2 {titer} — ABNORMAL HIGH
T Pallidum Abs: REACTIVE — AB

## 2020-02-12 LAB — HIV ANTIBODY (ROUTINE TESTING W REFLEX): HIV Screen 4th Generation wRfx: NONREACTIVE

## 2020-02-12 LAB — RPR: RPR Ser Ql: REACTIVE — AB

## 2020-02-12 LAB — HEPATITIS C ANTIBODY: Hep C Virus Ab: <0.1 s/co ratio (ref 0.0–0.9)

## 2020-04-04 ENCOUNTER — Encounter (HOSPITAL_BASED_OUTPATIENT_CLINIC_OR_DEPARTMENT_OTHER): Payer: Self-pay

## 2020-04-04 ENCOUNTER — Other Ambulatory Visit: Payer: Self-pay

## 2020-04-04 ENCOUNTER — Emergency Department (HOSPITAL_BASED_OUTPATIENT_CLINIC_OR_DEPARTMENT_OTHER)
Admission: EM | Admit: 2020-04-04 | Discharge: 2020-04-04 | Disposition: A | Discharge status: Home / Self Care | Payer: Medicaid Other | Attending: Emergency Medicine | Admitting: Emergency Medicine

## 2020-04-04 DIAGNOSIS — R109 Unspecified abdominal pain: Secondary | ICD-10-CM | POA: Diagnosis not present

## 2020-04-04 DIAGNOSIS — Z5321 Procedure and treatment not carried out due to patient leaving prior to being seen by health care provider: Secondary | ICD-10-CM | POA: Diagnosis not present

## 2020-04-04 LAB — URINALYSIS, ROUTINE W REFLEX MICROSCOPIC
Bilirubin Urine: NEGATIVE
Glucose, UA: NEGATIVE mg/dL
Hgb urine dipstick: NEGATIVE
Ketones, ur: NEGATIVE mg/dL
Leukocytes,Ua: NEGATIVE
Nitrite: NEGATIVE
Protein, ur: 30 mg/dL — AB
Specific Gravity, Urine: 1.025 (ref 1.005–1.030)
pH: 7 (ref 5.0–8.0)

## 2020-04-04 LAB — COMPREHENSIVE METABOLIC PANEL
ALT: 24 U/L (ref 0–44)
AST: 30 U/L (ref 15–41)
Albumin: 4.4 g/dL (ref 3.5–5.0)
Alkaline Phosphatase: 65 U/L (ref 38–126)
Anion gap: 12 (ref 5–15)
BUN: 12 mg/dL (ref 6–20)
CO2: 23 mmol/L (ref 22–32)
Calcium: 9.3 mg/dL (ref 8.9–10.3)
Chloride: 104 mmol/L (ref 98–111)
Creatinine, Ser: 0.77 mg/dL (ref 0.44–1.00)
GFR calc Af Amer: >60 mL/min (ref >60)
GFR calc non Af Amer: >60 mL/min (ref >60)
Glucose, Bld: 169 mg/dL — ABNORMAL HIGH (ref 70–99)
Potassium: 3.2 mmol/L — ABNORMAL LOW (ref 3.5–5.1)
Sodium: 139 mmol/L (ref 135–145)
Total Bilirubin: 0.9 mg/dL (ref 0.3–1.2)
Total Protein: 8.4 g/dL — ABNORMAL HIGH (ref 6.5–8.1)

## 2020-04-04 LAB — CBC
HCT: 37.4 % (ref 36.0–46.0)
Hemoglobin: 11.8 g/dL — ABNORMAL LOW (ref 12.0–15.0)
MCH: 26.9 pg (ref 26.0–34.0)
MCHC: 31.6 g/dL (ref 30.0–36.0)
MCV: 85.2 fL (ref 80.0–100.0)
Platelets: 295 10*3/uL (ref 150–400)
RBC: 4.39 MIL/uL (ref 3.87–5.11)
RDW: 13.9 % (ref 11.5–15.5)
WBC: 7.2 10*3/uL (ref 4.0–10.5)
nRBC: 0 % (ref 0.0–0.2)

## 2020-04-04 LAB — PREGNANCY, URINE: Preg Test, Ur: NEGATIVE

## 2020-04-04 LAB — LIPASE, BLOOD: Lipase: 19 U/L (ref 11–51)

## 2020-04-04 LAB — URINALYSIS, MICROSCOPIC (REFLEX)

## 2020-04-04 NOTE — ED Notes (Signed)
Per registration, pt LWBS after triage 

## 2020-04-04 NOTE — ED Triage Notes (Signed)
Pt arrives with c/o abdominal pain X2 days with NVD. Pt denies fever at home.

## 2020-08-30 ENCOUNTER — Ambulatory Visit: Payer: Medicaid Other | Admitting: Obstetrics

## 2020-08-30 ENCOUNTER — Encounter: Payer: Self-pay | Admitting: Obstetrics

## 2020-08-30 ENCOUNTER — Other Ambulatory Visit (HOSPITAL_COMMUNITY)
Admission: RE | Admit: 2020-08-30 | Discharge: 2020-08-30 | Disposition: A | Discharge status: Home / Self Care | Payer: Medicaid Other | Source: Ambulatory Visit | Attending: Obstetrics | Admitting: Obstetrics

## 2020-08-30 ENCOUNTER — Other Ambulatory Visit: Payer: Self-pay

## 2020-08-30 ENCOUNTER — Ambulatory Visit (INDEPENDENT_AMBULATORY_CARE_PROVIDER_SITE_OTHER): Payer: Medicaid Other | Admitting: Obstetrics

## 2020-08-30 VITALS — BP 128/78 | HR 106 | Wt 150.0 lb

## 2020-08-30 DIAGNOSIS — N898 Other specified noninflammatory disorders of vagina: Secondary | ICD-10-CM | POA: Diagnosis not present

## 2020-08-30 DIAGNOSIS — K439 Ventral hernia without obstruction or gangrene: Secondary | ICD-10-CM

## 2020-08-30 DIAGNOSIS — B9689 Other specified bacterial agents as the cause of diseases classified elsewhere: Secondary | ICD-10-CM | POA: Diagnosis not present

## 2020-08-30 DIAGNOSIS — N760 Acute vaginitis: Secondary | ICD-10-CM

## 2020-08-30 MED ORDER — METRONIDAZOLE 500 MG PO TABS: 500.0000 mg | ORAL_TABLET | Freq: Two times a day (BID) | ORAL | 2 refills | Status: DC

## 2020-08-30 NOTE — Progress Notes (Signed)
Pt states d/c with foul odor. Pt would like full swab test.   Pt is having some abd/pelvic pain that comes and goes. Pt states she has an area around [employer] that will sometimes be swollen and painful.

## 2020-08-30 NOTE — Progress Notes (Signed)
Patient ID: Tracy Waller, female   DOB: [birth year], 29 y.o.   MRN: [id number]  Chief Complaint  Patient presents with  . Gynecologic Exam    HPI Tracy Waller is a 29 y.o. female.  Complains of malodorous vaginal discharge, and bulge above her umbilicus that is painful when she strains in that area. HPI  Past Medical History:  Diagnosis Date  . Hx of chlamydia infection 2017  . Hx of trichomoniasis 2017  . Syphilis     Past Surgical History:  Procedure Laterality Date  . CESAREAN SECTION      Family History  Problem Relation Age of Onset  . Asthma Father   . Hypertension Father   . Hypertension Mother   . Hypertension Sister     Social History Social History   Tobacco Use  . Smoking status: Current Every Day Smoker    Types: Cigarettes  . Smokeless tobacco: Never Used  Vaping Use  . Vaping Use: Never used  Substance Use Topics  . Alcohol use: No    Comment: occasionally  . Drug use: Yes    Types: Marijuana    No Known Allergies  Current Outpatient Medications  Medication Sig Dispense Refill  . metroNIDAZOLE (FLAGYL) 500 MG tablet Take 1 tablet (500 mg total) by mouth 2 (two) times daily. 14 tablet 2   No current facility-administered medications for this visit.    Review of Systems Review of Systems Constitutional: negative for fatigue and weight loss Respiratory: negative for cough and wheezing Cardiovascular: negative for chest pain, fatigue and palpitations Gastrointestinal: posive for abdominal pain from ventral hernia Genitourinary: positive for malodorous vaginal discharge Integument/breast: negative for nipple discharge Musculoskeletal:negative for myalgias Neurological: negative for gait problems and tremors Behavioral/Psych: negative for abusive relationship, depression Endocrine: negative for temperature intolerance      Blood pressure 128/78, pulse (!) 106, weight 150 lb (68 kg), last menstrual period 08/15/2020.  Physical  Exam Physical Exam           General: Alert and no distress Abdomen:   Bulge superior to umbilicus palpated with valsalva, and reduced with relaxation  Pelvis:  External genitalia: normal general appearance Urinary system: urethral meatus normal and bladder without fullness, nontender Vaginal: normal without tenderness, induration or masses Cervix: normal appearance Adnexa: normal bimanual exam Uterus: anteverted and non-tender, normal size    50% of 15 min visit spent on counseling and coordination of care.   Data Reviewed Wet Prep and Cultures  Assessment     1. Vaginal discharge Rx: - Cervicovaginal ancillary only( [facility])  2. BV (bacterial vaginosis) Rx: - metroNIDAZOLE (FLAGYL) 500 MG tablet; Take 1 tablet (500 mg total) by mouth 2 (two) times daily.  Dispense: 14 tablet; Refill: 2  3. Ventral hernia without obstruction or gangrene Rx: - Ambulatory referral to General Surgery    Plan   Follow up with in 3 months for Annual  Orders Placed This Encounter  Procedures  . Ambulatory referral to General Surgery    Referral Priority:   Routine    Referral Type:   Surgical    Referral Reason:   Specialty Services Required    Requested Specialty:   General Surgery    Number of Visits Requested:   1   Meds ordered this encounter  Medications  . metroNIDAZOLE (FLAGYL) 500 MG tablet    Sig: Take 1 tablet (500 mg total) by mouth 2 (two) times daily.    Dispense:  14 tablet  Refill:  2    Brock Bad, MD 08/30/2020 2:17 PM

## 2020-08-30 NOTE — Patient Instructions (Signed)
Umbilical Hernia, Adult  A hernia is a bulge of tissue that pushes through an opening between muscles. An umbilical hernia happens in the abdomen, near the belly button (umbilicus). The hernia may contain tissues from the small intestine, large intestine, or fatty tissue covering the intestines (omentum). Umbilical hernias in adults tend to get worse over time, and they require surgical treatment. There are several types of umbilical hernias. You may have:  A hernia located just above or below the umbilicus (indirect hernia). This is the most common type of umbilical hernia in adults.  A hernia that forms through an opening formed by the umbilicus (direct hernia).  A hernia that comes and goes (reducible hernia). A reducible hernia may be visible only when you strain, lift something heavy, or cough. This type of hernia can be pushed back into the abdomen (reduced).  A hernia that traps abdominal tissue inside the hernia (incarcerated hernia). This type of hernia cannot be reduced.  A hernia that cuts off blood flow to the tissues inside the hernia (strangulated hernia). The tissues can start to die if this happens. This type of hernia requires emergency treatment. What are the causes? An umbilical hernia happens when tissue inside the abdomen presses on a weak area of the abdominal muscles. What increases the risk? You may have a greater risk of this condition if you:  Are obese.  Have had several pregnancies.  Have a buildup of fluid inside your abdomen (ascites).  Have had surgery that weakens the abdominal muscles. What are the signs or symptoms? The main symptom of this condition is a painless bulge at or near the belly button. A reducible hernia may be visible only when you strain, lift something heavy, or cough. Other symptoms may include:  Dull pain.  A feeling of pressure. Symptoms of a strangulated hernia may include:  Pain that gets increasingly worse.  Nausea and  vomiting.  Pain when pressing on the hernia.  Skin over the hernia becoming red or purple.  Constipation.  Blood in the stool. How is this diagnosed? This condition may be diagnosed based on:  A physical exam. You may be asked to cough or strain while standing. These actions increase the pressure inside your abdomen and force the hernia through the opening in your muscles. Your health care provider may try to reduce the hernia by pressing on it.  Your symptoms and medical history. How is this treated? Surgery is the only treatment for an umbilical hernia. Surgery for a strangulated hernia is done as soon as possible. If you have a small hernia that is not incarcerated, you may need to lose weight before having surgery. Follow these instructions at home:  Lose weight, if told by your health care provider.  Do not try to push the hernia back in.  Watch your hernia for any changes in color or size. Tell your health care provider if any changes occur.  You may need to avoid activities that increase pressure on your hernia.  Do not lift anything that is heavier than 10 lb (4.5 kg) until your health care provider says that this is safe.  Take over-the-counter and prescription medicines only as told by your health care provider.  Keep all follow-up visits as told by your health care provider. This is important. Contact a health care provider if:  Your hernia gets larger.  Your hernia becomes painful. Get help right away if:  You develop sudden, severe pain near the area of your hernia.    You have pain as well as nausea or vomiting.  You have pain and the skin over your hernia changes color.  You develop a fever. This information is not intended to replace advice given to you by your health care provider. Make sure you discuss any questions you have with your health care provider. Document Revised: 10/24/2017 Document Reviewed: 03/12/2017 Elsevier Patient Education  2020  Elsevier Inc[employer]  

## 2020-08-31 ENCOUNTER — Telehealth: Payer: Self-pay | Admitting: *Deleted

## 2020-08-31 ENCOUNTER — Other Ambulatory Visit: Payer: Self-pay | Admitting: Obstetrics

## 2020-08-31 LAB — CERVICOVAGINAL ANCILLARY ONLY
Bacterial Vaginitis (gardnerella): POSITIVE — AB
Candida Glabrata: NEGATIVE
Candida Vaginitis: NEGATIVE
Chlamydia: NEGATIVE
Comment: NEGATIVE
Comment: NEGATIVE
Comment: NEGATIVE
Comment: NEGATIVE
Comment: NEGATIVE
Comment: NORMAL
Neisseria Gonorrhea: NEGATIVE
Trichomonas: NEGATIVE

## 2020-08-31 NOTE — Telephone Encounter (Signed)
Called patient in regarding to test result. Patient verified DOB. Vaginal culture positive for BV. Metronidazole 500 mg 1 tablet BID x 7 day sent to pharmacy.   Clovis Pu, RN

## 2020-08-31 NOTE — Telephone Encounter (Signed)
-----   Message from Brock Bad, MD sent at 08/31/2020  2:32 PM EST ----- Flagyl Rx for BV.

## 2020-09-20 ENCOUNTER — Other Ambulatory Visit: Payer: Self-pay

## 2020-09-20 MED ORDER — METRONIDAZOLE 500 MG PO TABS
500.0000 mg | ORAL_TABLET | Freq: Two times a day (BID) | ORAL | 0 refills | Status: DC
Start: 2020-09-20 — End: 2020-09-23

## 2020-09-20 NOTE — Telephone Encounter (Signed)
Call patient concerning medication that was sent to the pharmacy.Patient never went to pick up rx so they sent put it back.Will send in another refill

## 2020-09-23 ENCOUNTER — Ambulatory Visit (INDEPENDENT_AMBULATORY_CARE_PROVIDER_SITE_OTHER): Payer: Medicaid Other | Admitting: Obstetrics & Gynecology

## 2020-09-23 ENCOUNTER — Other Ambulatory Visit: Payer: Self-pay

## 2020-09-23 VITALS — BP 147/88 | HR 89 | Wt 152.0 lb

## 2020-09-23 DIAGNOSIS — S30824A Blister (nonthermal) of vagina and vulva, initial encounter: Secondary | ICD-10-CM | POA: Diagnosis not present

## 2020-09-23 DIAGNOSIS — Z3201 Encounter for pregnancy test, result positive: Secondary | ICD-10-CM

## 2020-09-23 DIAGNOSIS — L089 Local infection of the skin and subcutaneous tissue, unspecified: Secondary | ICD-10-CM | POA: Diagnosis not present

## 2020-09-23 MED ORDER — AMOXICILLIN-POT CLAVULANATE 875-125 MG PO TABS: 1.0000 | ORAL_TABLET | Freq: Two times a day (BID) | ORAL | 1 refills | Status: DC

## 2020-09-23 MED ORDER — COMPLETENATE 29-1 MG PO CHEW: 1.0000 | CHEWABLE_TABLET | Freq: Every day | ORAL | Status: DC

## 2020-09-23 NOTE — Progress Notes (Signed)
Patient ID: Tracy Waller, female   DOB: [birth year], 29 y.o.   MRN: [id number]  Chief Complaint  Patient presents with  . Gynecologic Exam    HPI Tracy Waller is a 29 y.o. female.  Patient's last menstrual period was 08/12/2020. She has sore hair bumps on vulva for 2 days, new positive pregnancy test in the office now  HPI  Past Medical History:  Diagnosis Date  . Hx of chlamydia infection 2017  . Hx of trichomoniasis 2017  . Syphilis     Past Surgical History:  Procedure Laterality Date  . CESAREAN SECTION      Family History  Problem Relation Age of Onset  . Asthma Father   . Hypertension Father   . Hypertension Mother   . Hypertension Sister     Social History Social History   Tobacco Use  . Smoking status: Current Every Day Smoker    Types: Cigarettes  . Smokeless tobacco: Never Used  Vaping Use  . Vaping Use: Never used  Substance Use Topics  . Alcohol use: No    Comment: occasionally  . Drug use: Yes    Types: Marijuana    No Known Allergies  Current Outpatient Medications  Medication Sig Dispense Refill  . amoxicillin-clavulanate (AUGMENTIN) 875-125 MG tablet Take 1 tablet by mouth 2 (two) times daily. 14 tablet 1   No current facility-administered medications for this visit.    Review of Systems Review of Systems  Constitutional: Negative.   Genitourinary: Positive for vaginal pain (vulva). Negative for vaginal bleeding and vaginal discharge.    Blood pressure (!) 147/88, pulse 89, weight 152 lb (68.9 kg), last menstrual period 08/12/2020.  Physical Exam Physical Exam Vitals and nursing note reviewed. Exam conducted with a chaperone present.  Constitutional:      Appearance: Normal appearance.  Pulmonary:     Effort: Pulmonary effort is normal.  Genitourinary:   Neurological:     Mental Status: She is alert.   tenser follicle cysts on vulva Data Reviewed Pap result  Assessment Blister of vulva with infection, initial  encounter - Plan: POCT urine pregnancy, amoxicillin-clavulanate (AUGMENTIN) 875-125 MG tablet  Positive pregnancy test    Plan Meds ordered this encounter  Medications  . amoxicillin-clavulanate (AUGMENTIN) 875-125 MG tablet    Sig: Take 1 tablet by mouth 2 (two) times daily.    Dispense:  14 tablet    Refill:  1  . prenatal vitamin w/FE, FA (NATACHEW) chewable tablet 1 tablet     Schedule prenatal care  Scheryl Darter 09/23/2020, 2:57 PM

## 2020-09-23 NOTE — Patient Instructions (Signed)
First Trimester of Pregnancy The first trimester of pregnancy is from week 1 until the end of week 13 (months 1 through 3). A week after a sperm fertilizes an egg, the egg will implant on the wall of the uterus. This embryo will begin to develop into a baby. Genes from you and your partner will form the baby. The female genes will determine whether the baby will be a boy or a girl. At 6-8 weeks, the eyes and face will be formed, and the heartbeat can be seen on ultrasound. At the end of 12 weeks, all the baby's organs will be formed. Now that you are pregnant, you will want to do everything you can to have a healthy baby. Two of the most important things are to get good prenatal care and to follow your health care provider's instructions. Prenatal care is all the medical care you receive before the baby's birth. This care will help prevent, find, and treat any problems during the pregnancy and childbirth. Body changes during your first trimester Your body goes through many changes during pregnancy. The changes vary from woman to woman.  You may gain or lose a couple of pounds at first.  You may feel sick to your stomach (nauseous) and you may throw up (vomit). If the vomiting is uncontrollable, call your health care provider.  You may tire easily.  You may develop headaches that can be relieved by medicines. All medicines should be approved by your health care provider.  You may urinate more often. Painful urination may mean you have a bladder infection.  You may develop heartburn as a result of your pregnancy.  You may develop constipation because certain hormones are causing the muscles that push stool through your intestines to slow down.  You may develop hemorrhoids or swollen veins (varicose veins).  Your breasts may begin to grow larger and become tender. Your nipples may stick out more, and the tissue that surrounds them (areola) may become darker.  Your gums may bleed and may be  sensitive to brushing and flossing.  Dark spots or blotches (chloasma, mask of pregnancy) may develop on your face. This will likely fade after the baby is born.  Your menstrual periods will stop.  You may have a loss of appetite.  You may develop cravings for certain kinds of food.  You may have changes in your emotions from day to day, such as being excited to be pregnant or being concerned that something may go wrong with the pregnancy and baby.  You may have more vivid and strange dreams.  You may have changes in your hair. These can include thickening of your hair, rapid growth, and changes in texture. Some women also have hair loss during or after pregnancy, or hair that feels dry or thin. Your hair will most likely return to normal after your baby is born. What to expect at prenatal visits During a routine prenatal visit:  You will be weighed to make sure you and the baby are growing normally.  Your blood pressure will be taken.  Your abdomen will be measured to track your baby's growth.  The fetal heartbeat will be listened to between weeks 10 and 14 of your pregnancy.  Test results from any previous visits will be discussed. Your health care provider may ask you:  How you are feeling.  If you are feeling the baby move.  If you have had any abnormal symptoms, such as leaking fluid, bleeding, severe headaches, or abdominal   cramping.  If you are using any tobacco products, including cigarettes, chewing tobacco, and electronic cigarettes.  If you have any questions. Other tests that may be performed during your first trimester include:  Blood tests to find your blood type and to check for the presence of any previous infections. The tests will also be used to check for low iron levels (anemia) and protein on red blood cells (Rh antibodies). Depending on your risk factors, or if you previously had diabetes during pregnancy, you may have tests to check for high blood sugar  that affects pregnant women (gestational diabetes).  Urine tests to check for infections, diabetes, or protein in the urine.  An ultrasound to confirm the proper growth and development of the baby.  Fetal screens for spinal cord problems (spina bifida) and Down syndrome.  HIV (human immunodeficiency virus) testing. Routine prenatal testing includes screening for HIV, unless you choose not to have this test.  You may need other tests to make sure you and the baby are doing well. Follow these instructions at home: Medicines  Follow your health care provider's instructions regarding medicine use. Specific medicines may be either safe or unsafe to take during pregnancy.  Take a prenatal vitamin that contains at least 600 micrograms (mcg) of folic acid.  If you develop constipation, try taking a stool softener if your health care provider approves. Eating and drinking   Eat a balanced diet that includes fresh fruits and vegetables, whole grains, good sources of protein such as meat, eggs, or tofu, and low-fat dairy. Your health care provider will help you determine the amount of weight gain that is right for you.  Avoid raw meat and uncooked cheese. These carry germs that can cause birth defects in the baby.  Eating four or five small meals rather than three large meals a day may help relieve nausea and vomiting. If you start to feel nauseous, eating a few soda crackers can be helpful. Drinking liquids between meals, instead of during meals, also seems to help ease nausea and vomiting.  Limit foods that are high in fat and processed sugars, such as fried and sweet foods.  To prevent constipation: ? Eat foods that are high in fiber, such as fresh fruits and vegetables, whole grains, and beans. ? Drink enough fluid to keep your urine clear or pale yellow. Activity  Exercise only as directed by your health care provider. Most women can continue their usual exercise routine during  pregnancy. Try to exercise for 30 minutes at least 5 days a week. Exercising will help you: ? Control your weight. ? Stay in shape. ? Be prepared for labor and delivery.  Experiencing pain or cramping in the lower abdomen or lower back is a good sign that you should stop exercising. Check with your health care provider before continuing with normal exercises.  Try to avoid standing for long periods of time. Move your legs often if you must stand in one place for a long time.  Avoid heavy lifting.  Wear low-heeled shoes and practice good posture.  You may continue to have sex unless your health care provider tells you not to. Relieving pain and discomfort  Wear a good support bra to relieve breast tenderness.  Take warm sitz baths to soothe any pain or discomfort caused by hemorrhoids. Use hemorrhoid cream if your health care provider approves.  Rest with your legs elevated if you have leg cramps or low back pain.  If you develop varicose veins in   your legs, wear support hose. Elevate your feet for 15 minutes, 3-4 times a day. Limit salt in your diet. Prenatal care  Schedule your prenatal visits by the twelfth week of pregnancy. They are usually scheduled monthly at first, then more often in the last 2 months before delivery.  Write down your questions. Take them to your prenatal visits.  Keep all your prenatal visits as told by your health care provider. This is important. Safety  Wear your seat belt at all times when driving.  Make a list of emergency phone numbers, including numbers for family, friends, the hospital, and police and fire departments. General instructions  Ask your health care provider for a referral to a local prenatal education class. Begin classes no later than the beginning of month 6 of your pregnancy.  Ask for help if you have counseling or nutritional needs during pregnancy. Your health care provider can offer advice or refer you to specialists for help  with various needs.  Do not use hot tubs, steam rooms, or saunas.  Do not douche or use tampons or scented sanitary pads.  Do not cross your legs for long periods of time.  Avoid cat litter boxes and soil used by cats. These carry germs that can cause birth defects in the baby and possibly loss of the fetus by miscarriage or stillbirth.  Avoid all smoking, herbs, alcohol, and medicines not prescribed by your health care provider. Chemicals in these products affect the formation and growth of the baby.  Do not use any products that contain nicotine or tobacco, such as cigarettes and e-cigarettes. If you need help quitting, ask your health care provider. You may receive counseling support and other resources to help you quit.  Schedule a dentist appointment. At home, brush your teeth with a soft toothbrush and be gentle when you floss. Contact a health care provider if:  You have dizziness.  You have mild pelvic cramps, pelvic pressure, or nagging pain in the abdominal area.  You have persistent nausea, vomiting, or diarrhea.  You have a bad smelling vaginal discharge.  You have pain when you urinate.  You notice increased swelling in your face, hands, legs, or ankles.  You are exposed to fifth disease or chickenpox.  You are exposed to German measles (rubella) and have never had it. Get help right away if:  You have a fever.  You are leaking fluid from your vagina.  You have spotting or bleeding from your vagina.  You have severe abdominal cramping or pain.  You have rapid weight gain or loss.  You vomit blood or material that looks like coffee grounds.  You develop a severe headache.  You have shortness of breath.  You have any kind of trauma, such as from a fall or a car accident. Summary  The first trimester of pregnancy is from week 1 until the end of week 13 (months 1 through 3).  Your body goes through many changes during pregnancy. The changes vary from  woman to woman.  You will have routine prenatal visits. During those visits, your health care provider will examine you, discuss any test results you may have, and talk with you about how you are feeling. This information is not intended to replace advice given to you by your health care provider. Make sure you discuss any questions you have with your health care provider. Document Revised: 08/24/2017 Document Reviewed: 08/23/2016 Elsevier Patient Education  2020 Elsevier Inc.  

## 2020-10-20 ENCOUNTER — Ambulatory Visit (INDEPENDENT_AMBULATORY_CARE_PROVIDER_SITE_OTHER): Payer: Medicaid Other

## 2020-10-20 ENCOUNTER — Other Ambulatory Visit: Payer: Self-pay

## 2020-10-20 VITALS — BP 127/86 | HR 76 | Ht 63.0 in | Wt 155.4 lb

## 2020-10-20 DIAGNOSIS — Z3481 Encounter for supervision of other normal pregnancy, first trimester: Secondary | ICD-10-CM

## 2020-10-20 DIAGNOSIS — O3680X0 Pregnancy with inconclusive fetal viability, not applicable or unspecified: Secondary | ICD-10-CM

## 2020-10-20 DIAGNOSIS — Z789 Other specified health status: Secondary | ICD-10-CM

## 2020-10-20 MED ORDER — PREPLUS 27-1 MG PO TABS: 1.0000 | ORAL_TABLET | Freq: Every day | ORAL | 13 refills | Status: DC

## 2020-10-20 MED ORDER — BLOOD PRESSURE KIT DEVI: 1.0000 | 0 refills | Status: DC

## 2020-10-20 NOTE — Progress Notes (Signed)
PRENATAL INTAKE SUMMARY  Ms. Dotts presents today New OB Nurse Interview.  OB History    Gravida  5   Para  3   Term  3   Preterm      AB  1   Living  3     SAB  1   IAB      Ectopic      Multiple      Live Births  3          I have reviewed the patient's medical, obstetrical, social, and family histories, medications, and available lab results.  SUBJECTIVE She has no unusual complaints  OBJECTIVE Initial Physical Exam (New OB)  GENERAL APPEARANCE: alert, well appearing   ASSESSMENT Normal pregnancy  PLAN Prenatal care to be completed at [place] OB labs to be completed at [facility] Provider Gem Ordered Blood Pressure kit ordered U/S performed today reveals single live IUP at 68w6dby [place] FHR 167 PHQ2 score: 0 GAD 7 score: 4

## 2020-10-20 NOTE — Progress Notes (Signed)
Agree with A & P. 

## 2020-10-26 NOTE — Progress Notes (Signed)
Agree with A & P. 

## 2020-10-28 ENCOUNTER — Encounter: Payer: Self-pay | Admitting: Obstetrics & Gynecology

## 2020-10-28 ENCOUNTER — Other Ambulatory Visit (HOSPITAL_COMMUNITY)
Admission: RE | Admit: 2020-10-28 | Discharge: 2020-10-28 | Disposition: A | Discharge status: Home / Self Care | Payer: Medicaid Other | Source: Ambulatory Visit

## 2020-10-28 ENCOUNTER — Other Ambulatory Visit: Payer: Self-pay

## 2020-10-28 ENCOUNTER — Ambulatory Visit (INDEPENDENT_AMBULATORY_CARE_PROVIDER_SITE_OTHER): Payer: Medicaid Other | Admitting: Obstetrics & Gynecology

## 2020-10-28 VITALS — BP 133/86 | HR 101 | Wt 156.0 lb

## 2020-10-28 DIAGNOSIS — N898 Other specified noninflammatory disorders of vagina: Secondary | ICD-10-CM | POA: Diagnosis present

## 2020-10-28 DIAGNOSIS — Z3A11 11 weeks gestation of pregnancy: Secondary | ICD-10-CM

## 2020-10-28 DIAGNOSIS — Z3481 Encounter for supervision of other normal pregnancy, first trimester: Secondary | ICD-10-CM | POA: Diagnosis not present

## 2020-10-28 DIAGNOSIS — Z8619 Personal history of other infectious and parasitic diseases: Secondary | ICD-10-CM

## 2020-10-28 DIAGNOSIS — Z113 Encounter for screening for infections with a predominantly sexual mode of transmission: Secondary | ICD-10-CM | POA: Diagnosis not present

## 2020-10-28 DIAGNOSIS — Z315 Encounter for genetic counseling: Secondary | ICD-10-CM | POA: Diagnosis not present

## 2020-10-28 DIAGNOSIS — Z98891 History of uterine scar from previous surgery: Secondary | ICD-10-CM

## 2020-10-28 MED ORDER — BLOOD PRESSURE KIT DEVI: 1.0000 | 0 refills | Status: DC

## 2020-10-28 NOTE — Progress Notes (Signed)
History:   Tracy Waller is a 30 y.o. [id number] at 11w0dby LMP being seen today for her first obstetrical visit.  Her obstetrical history is significant for prior cesarean section and successful VBAC. Patient does intend to breast feed. Pregnancy history fully reviewed.  Patient reports nausea but no emesis.   H/o syphilis that was treated about 4 years ago.     HISTORY: OB History  Gravida Para Term Preterm AB Living  5 3 3  0 1 3  SAB IAB Ectopic Multiple Live Births  1 0 0 0 3    # Outcome Date GA Lbr Len/2nd Weight Sex Delivery Anes PTL Lv  5 Current           4 SAB 2017          3 Term 06/06/13 [id number] / 00:48 7 lb 11.2 oz (3.493 kg) F VBAC EPI  LIV     Name: Tracy Waller   Apgar1: 9  Apgar5: 10  2 Term 07/26/10    M CS-LTranv   LIV  1 Term 06/25/08    F Vag-Spont   LIV    Last pap smear was done 11/12/2019 and was normal  Past Medical History:  Diagnosis Date  . Hx of chlamydia infection 2017  . Hx of trichomoniasis 2017  . Syphilis    Past Surgical History:  Procedure Laterality Date  . CESAREAN SECTION     Family History  Problem Relation Age of Onset  . Asthma Father   . Hypertension Father   . Hypertension Mother   . Hypertension Sister    Social History   Tobacco Use  . Smoking status: Former Smoker    Types: Cigarettes  . Smokeless tobacco: Never Used  . Tobacco comment: last used about 2 weeks ago  Vaping Use  . Vaping Use: Never used  Substance Use Topics  . Alcohol use: Not Currently    Comment: occasionally  . Drug use: Yes    Types: Marijuana    Comment: daily   No Known Allergies Current Outpatient Medications on File Prior to Visit  Medication Sig Dispense Refill  . Prenatal Vit-Fe Fumarate-FA (PREPLUS) 27-1 MG TABS Take 1 tablet by mouth daily. (Patient not taking: Reported on 10/28/2020) 30 tablet 13   No current facility-administered medications on file prior to visit.    Review of Systems Pertinent items noted in  HPI and remainder of comprehensive ROS otherwise negative.  Physical Exam:   Vitals:   10/28/20 1511  BP: 133/86  Pulse: (!) 101  Weight: 156 lb (70.8 kg)   Fetal Heart Rate (bpm): 168 Bedside Ultrasound for FHR check: Viable intrauterine pregnancy with positive cardiac activity noted, fetal heart rate 168 bpm Patient informed that the ultrasound is considered a limited obstetric ultrasound and is not intended to be a complete ultrasound exam.  Patient also informed that the ultrasound is not being completed with the intent of assessing for fetal or placental anomalies or any pelvic abnormalities.  Explained that the purpose of today's ultrasound is to assess for fetal heart rate.  Patient acknowledges the purpose of the exam and the limitations of the study. General: well-developed, well-nourished female in no acute distress  Breasts:  normal appearance, no masses or tenderness bilaterally  Skin: normal coloration and turgor, no rashes  Neurologic: oriented, normal, negative, normal mood  Extremities: normal strength, tone, and muscle mass, ROM of all joints is normal  HEENT PERRLA, extraocular movement intact  and sclera clear, anicteric  Neck supple and no masses  Cardiovascular: regular rate and rhythm  Respiratory:  no respiratory distress, normal breath sounds  Abdomen: soft, non-tender; bowel sounds normal; no masses,  no organomegaly  Pelvic: normal external genitalia, no lesions, normal vaginal mucosa, normal vaginal discharge, normal cervix, No pap smear done. Uterine size:  10-12 weeks    Assessment:    Pregnancy: [id number] Patient Active Problem List   Diagnosis Date Noted  . Encounter for supervision of normal pregnancy in multigravida in first trimester 10/20/2020  . Neurosyphilis   . Trichomonas vaginalis (TV) infection   . Chlamydia   . Problems related to high-risk sexual behavior   . Syphilis 02/13/2017  . Anemia 05/27/2013  . H/O cesarean section 05/27/2013  .  History of pregnancy induced hypertension 05/27/2013  . GERD without esophagitis 02/20/2013     Plan:    1. Encounter for supervision of normal pregnancy in multigravida in first trimester - PNV rx to pharmacy. - Blood Pressure Monitoring (BLOOD PRESSURE KIT) DEVI; 1 kit by Does not apply route once a week.  Dispense: 1 each; Refill: 0 - [place] MFM OB COMP + 14 WK; Future  2. Vaginal discharge - Cervicovaginal ancillary only( [place])  3. [redacted] weeks gestation of pregnancy - CBC/D/Plt+RPR+Rh+ABO+Rub Ab... - Cervicovaginal ancillary only( [facility]) - Culture, OB Urine - Genetic Screening  4. History of syphilis  5. H/O cesarean section - h/o successful VBAC.  Desires again this pregnancy.   Initial labs drawn. Continue prenatal vitamins. Problem list reviewed and updated. Genetic Screening discussed, NIPS requested. Ultrasound discussed; fetal anatomic survey: requested. Anticipatory guidance about prenatal visits given including labs, ultrasounds, and testing. Discussed usage of Babyscripts and virtual visits as additional source of managing and completing prenatal visits in midst of coronavirus and pandemic.   Encouraged to complete MyChart Registration for her ability to review results, send requests, and have questions addressed.  The nature of [place] for [facility] with multiple MDs and Advanced Practice Providers was explained to patient; also emphasized that residents, students are part of our team. Routine obstetric precautions reviewed. Encouraged to seek out care at office or emergency room [facility] preferred) for urgent and/or emergent concerns.  Return in about 4 weeks (around 11/25/2020) for Office ob visit (MD or APP).     Felipa Emory, MD, [place], [facility], [place]

## 2020-10-28 NOTE — Progress Notes (Signed)
NOB Planned : No  Last pap: per pt 11/12/2019 WNL  Genetic Screening: Desires  B/P Cuff resent.   CC: None

## 2020-10-30 LAB — URINE CULTURE, OB REFLEX

## 2020-10-30 LAB — CULTURE, OB URINE

## 2020-10-31 LAB — CERVICOVAGINAL ANCILLARY ONLY
Bacterial Vaginitis (gardnerella): NEGATIVE
Candida Glabrata: NEGATIVE
Candida Vaginitis: NEGATIVE
Chlamydia: NEGATIVE
Comment: NEGATIVE
Comment: NEGATIVE
Comment: NEGATIVE
Comment: NEGATIVE
Comment: NEGATIVE
Comment: NORMAL
Neisseria Gonorrhea: NEGATIVE
Trichomonas: NEGATIVE

## 2020-11-03 LAB — CBC/D/PLT+RPR+RH+ABO+RUB AB...
Antibody Screen: NEGATIVE
Basophils Absolute: 0 10*3/uL (ref 0.0–0.2)
Basos: 0 %
EOS (ABSOLUTE): 0.1 10*3/uL (ref 0.0–0.4)
Eos: 1 %
HCV Ab: <0.1 s/co ratio (ref 0.0–0.9)
HIV Screen 4th Generation wRfx: NONREACTIVE
Hematocrit: 34.6 % (ref 34.0–46.6)
Hemoglobin: 11.3 g/dL (ref 11.1–15.9)
Hepatitis B Surface Ag: NEGATIVE
Immature Grans (Abs): 0.1 10*3/uL (ref 0.0–0.1)
Immature Granulocytes: 1 %
Lymphocytes Absolute: 2.1 10*3/uL (ref 0.7–3.1)
Lymphs: 24 %
MCH: 27 pg (ref 26.6–33.0)
MCHC: 32.7 g/dL (ref 31.5–35.7)
MCV: 83 fL (ref 79–97)
Monocytes Absolute: 0.5 10*3/uL (ref 0.1–0.9)
Monocytes: 5 %
Neutrophils Absolute: 6.3 10*3/uL (ref 1.4–7.0)
Neutrophils: 69 %
Platelets: 277 10*3/uL (ref 150–450)
RBC: 4.18 x10E6/uL (ref 3.77–5.28)
RDW: 13.3 % (ref 11.7–15.4)
RPR Ser Ql: REACTIVE — AB
Rh Factor: POSITIVE
Rubella Antibodies, IGG: 1.59 index (ref >0.99)
WBC: 9.1 10*3/uL (ref 3.4–10.8)

## 2020-11-03 LAB — RPR, QUANT+TP ABS (REFLEX)
Rapid Plasma Reagin, Quant: 1:1 {titer} — ABNORMAL HIGH
T Pallidum Abs: REACTIVE — AB

## 2020-11-03 LAB — HCV INTERPRETATION

## 2020-11-08 ENCOUNTER — Encounter: Payer: Self-pay | Admitting: Obstetrics and Gynecology

## 2020-11-16 ENCOUNTER — Other Ambulatory Visit: Payer: Self-pay | Admitting: Obstetrics and Gynecology

## 2020-11-16 ENCOUNTER — Encounter: Payer: Self-pay | Admitting: Obstetrics and Gynecology

## 2020-11-16 DIAGNOSIS — Z3481 Encounter for supervision of other normal pregnancy, first trimester: Secondary | ICD-10-CM

## 2020-11-16 DIAGNOSIS — Q998 Other specified chromosome abnormalities: Secondary | ICD-10-CM | POA: Insufficient documentation

## 2020-11-18 ENCOUNTER — Other Ambulatory Visit (HOSPITAL_BASED_OUTPATIENT_CLINIC_OR_DEPARTMENT_OTHER): Payer: Self-pay | Admitting: Obstetrics & Gynecology

## 2020-11-18 ENCOUNTER — Encounter: Payer: Self-pay | Admitting: Obstetrics and Gynecology

## 2020-11-18 DIAGNOSIS — R898 Other abnormal findings in specimens from other organs, systems and tissues: Secondary | ICD-10-CM

## 2020-11-19 ENCOUNTER — Telehealth: Payer: Self-pay

## 2020-11-19 NOTE — Telephone Encounter (Signed)
Rev'd voicemail from pt requesting a nurse Unable to reach pt, vm not set up

## 2020-11-24 ENCOUNTER — Other Ambulatory Visit: Payer: Self-pay

## 2020-11-24 ENCOUNTER — Ambulatory Visit: Payer: Self-pay

## 2020-11-24 ENCOUNTER — Ambulatory Visit: Payer: Medicaid Other | Attending: Obstetrics and Gynecology | Admitting: Genetic Counselor

## 2020-11-24 DIAGNOSIS — O285 Abnormal chromosomal and genetic finding on antenatal screening of mother: Secondary | ICD-10-CM

## 2020-11-24 DIAGNOSIS — D563 Thalassemia minor: Secondary | ICD-10-CM

## 2020-11-24 DIAGNOSIS — Z315 Encounter for genetic counseling: Secondary | ICD-10-CM | POA: Diagnosis not present

## 2020-11-24 NOTE — Progress Notes (Signed)
11/24/2020  TOSHIE DEMELO Apr 08, 1991 MRN: [id number] DOV: 11/24/2020  Ms. 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 Ms. Begnaud was accompanied to her appointment by her partner, Cathleen Corti.   Indication for genetic counseling - NIPS atypical finding on sex chromosomes  - Silent carrier for alpha-thalassemia  Prenatal history  Ms. Giese is a [id number], 30 y.o. female. Her current pregnancy has completed [redacted]w[redacted]d (Estimated Date of Delivery: 05/19/21). Ms. Mcclafferty has a 72 year old daughter and a 58 year old son from a prior relationship. She also has a 25 year old daughter with a different partner. She has also had one miscarriage at 7 weeks' gestation. The current pregnancy is the first for this couple.   Ms. Wiginton denied exposure to environmental toxins or chemical agents. She denied the use of alcohol or street drugs. She reported smoking 1-2 cigarettes per week, down from 1-2 packs per day. She reported taking prenatal vitamins. She denied significant viral illnesses, fevers, and bleeding during the course of her pregnancy. Her medical and surgical histories were noncontributory.  Prenatal exposure to tobacco can increase the risk for placenta previa and placental abruption, preterm birth, low birth weight, oral clefts, stillbirth, and sudden infant death syndrome (SIDS). Prenatal exposure to nicotine is also associated with an increased likelihood of neurological disorder and asthma. The risk of pregnancy complications associated with cigarette exposure tends to increase with the amount of cigarettes a woman smokes. I praised Ms. Meche for cutting back on smoking so significantly and encouraged her to continue reducing the number of cigarettes smoked per day with the goal to eventually quit smoking altogether.  Family History  A three  generation pedigree was drafted and reviewed. The family history is remarkable for the following:  - Ms. Alatorre's daughter has sickle cell trait. Individuals with sickle cell trait are carriers of a condition called sickle cell disease (SCD). SCD is inherited in an autosomal recessive pattern in which both parents must be carriers of the condition to be at risk of having an affected child. We discussed that Ms. Diggins had carrier screening performed for several conditions, including hemoglobinopathies such as SCD. Her carrier screening did not identify her as a carrier of a hemoglobinopathy; thus, her daughter likely inherited sickle cell trait from her father, and Ms. Embry's risk of having a child with SCD is significantly reduced.  - Ms. Maura had a paternal half sister who died at the age of three. Ms. Leys did not know the cause of death for this child; thus, risk assessment was limited.  - Ms. Cothern has a paternal half sister whose twin sons have developmental delays and learning difficulties after being born premature. We reviewed that premature birth can lead to long-term intellectual and developmental disabilities for babies.  The remaining family histories were reviewed and found to be noncontributory for birth defects, intellectual disability, recurrent pregnancy loss, and known genetic conditions.    The patient's ancestry is African American. The father of the pregnancy's ancestry is [place] [address] and Native [address] ([place]). Ashkenazi Jewish ancestry and consanguinity were denied. Pedigree will be scanned under Media.  Discussion  NIPS result:   Ms. Zuver was referred for a genetic counseling consultation as she had Panorama noninvasive prenatal screening (NIPS) through [place] that demonstrated an atypical finding on the sex chromosomes. The laboratory was unable to determine if this finding  was suspected to be maternal or fetal in nature. These results  also showed a less than 1 in 10,000 risk for trisomies 21, 18 and 13, a low risk for triploidy, and a 1 in 9000 risk for 22q11.2 deletion syndrome. A female fetus was predicted on NIPS.   We reviewed that NIPS analyzes cell-free fetal DNA from the placenta found in the maternal bloodstream during pregnancy. NIPS is used to determine the risk for missing or extra placental chromosomal material for a specific subset of chromosomes. However, maternal DNA is also present in a NIPS sample. Based on this, we discussed that there are several possibilities that could warrant Ms. Degracia's abnormal NIPS result. One possibility is the fetus having a genetic change involving the sex chromosomes. Another possibility is Ms. Vargus herself having a genetic change involving her sex chromosomes. A second possibility is fetal or maternal mosaicism for a change involving the sex chromosomes. Mosaicism occurs when not every cell in the body is genetically identical; some cells in the body may have a genetic change involving the sex chromosomes, whereas others may have normal sex chromosomes or a different abnormal chromosomal complement. A third possibility is that of confined placental mosaicism, or a result representative of the placenta only rather than the fetus. Finally, it is possible that this is a false positive result.    We reviewed that even if the fetus were to have a sex chromosome abnormality, it would not be possible for us to predict if this would be a benign change or one that would have implications for health or development based on this NIPS result alone. We briefly reviewed monosomy X (Turner syndrome) and mosaic monosomy X as examples of sex chromosome abnormalities that can have clinical implications. We also reviewed the limitations of NIPS, including the fact that it is not diagnostic and that it does not identify all genetic conditions. We discussed that without knowing the precise reason for this  abnormal NIPS result, we are limited in understanding if there may be any potentially clinically relevant concerns for the fetus.   Testing options:   We reviewed available testing options to attempt to get more information to clarify this finding. Firstly, Ms. Barbette MerinoBreeden was informed that diagnostic testing via amniocentesis any time after 16 weeks' gestation would be the only way to determine if the fetus has a chromosomal abnormality involving the sex chromosomes prenatally. We discussed the technical aspects of the procedure and quoted up to a 1 in 500 (0.2%) risk for spontaneous pregnancy loss or other adverse pregnancy outcomes as a result of amniocentesis. Cultured cells from an amniocentesis sample allow for the visualization of a fetal karyotype, which can detect >99% of large chromosomal aberrations. Chromosomal microarray can also be performed to identify smaller deletions or duplications of fetal chromosomal material that fall below the resolution of karyotype. Ms. Barbette MerinoBreeden was informed that if a sex chromosome abnormality were identified, we would then be able to research whether the finding is a benign change or one that could have implications for the fetus's postnatal health or development.    Ms. Barbette MerinoBreeden was also made aware that should amniocentesis not be desired, she can continue with standard ultrasounds to monitor for fetal anomalies. She may wait to pursue genetic testing postnatally if clinically indicated. Finally, Ms. Barbette MerinoBreeden was informed that we could consider pursuing a maternal karyotype to determine if she herself has a genetic change involving her sex chromosomes that she could possibly pass on to a fetus.  Carrier screening results:  We also reviewed Ms. Sacca's Horizon-4 carrier screening results. The results of the screen identified her as a silent carrier for alpha-thalassemia (aa/a-). Alpha-thalassemia is different in its inheritance compared to other hemoglobinopathies as  there are two copies of two alpha globin genes (HBA1 and HBA2) on each chromosome 16, or four alpha globin genes total (aa/aa). A person can be a carrier of one alpha gene mutation (aa/a-), also referred to as a "silent carrier". A person who carries two alpha globin gene mutations can either carry them in cis (both on the same chromosome, denoted as aa/--) or in trans (on different chromosomes, denoted as a-/a-). Alpha-thalassemia carriers of two mutations who have African American ancestry are more likely to have a trans arrangement (a-/a-); cis configuration is reported to be rare in individuals with African American ancestry.     There are several different forms of alpha-thalassemia. The most severe form of alpha-thalassemia, Hb Barts, is associated with an absence of alpha globin chain synthesis as a result of deletions of all four alpha globin genes (--/--).  Given that Ms. Neidhardt is a silent carrier (aa/a-), her pregnancies would not be at increased risk for Hb Barts, even if her partner is a carrier for alpha-thalassemia, as she will always pass on at least one copy of the alpha globin gene to her children. Hemoglobin H (HbH) disease is caused by three deleted or dysfunctioning alpha globin alleles (a-/--) and is characterized by microcytic hypochromic hemolytic anemia, hepatosplenomegaly, mild jaundice, growth retardation, and sometimes thalassemia-like bone changes. Given Ms. Augenstein's silent carrier status (aa/a-), the current fetus would only be at risk for HbH disease (a-/--), if her partner is a carrier for two alpha globin mutations in cis (aa/--). If this is the case, the risk for HbH disease in the pregnancy would be 1 in 4 (25%). However, if Ms. Mehan's partner is a carrier for two alpha globin mutations, he would be more likely to carry them in trans configuration (a-/a-) than the cis configuration (aa/--), given his ethnicity. If he is a carrier of alpha-thalassemia in trans, then the  pregnancy would not be at increased risk for HbH disease. Based on the carrier frequency for alpha-thalassemia in the African American population, Ms. Mckiver partner has a 1 in 30 chance of being any type of carrier for alpha-thalassemia. Without partner carrier screening to refine risk, the couple has a <1% chance of having a child with HbH disease.  Ms. Townley carrier screening was negative for the other 3 conditions screened. Thus, her risk to be a carrier for these additional conditions (listed separately in the laboratory report) has been reduced but not eliminated. This also significantly reduces her risk of having a child affected by one of these conditions. We discussed that carrier testing for alpha-thalassemia is recommended for Ms. Buick's partner. Ms. Agrusa and her partner indicated that they may be interested in pursuing partner carrier screening.  Plan:  Ms. Mcginty requested to have more time before making a decision about whether or not she wants to pursue further testing. While she wants the information about the fetus's sex chromosomes that an amniocentesis would provide, she felt nervous about the risk of miscarriage associated with the procedure. However, she also feels like the uncertainty surrounding whether or not the fetus has a sex chromosome abnormality will be anxiety-provoking to her throughout the remainder of the pregnancy. We made a plan for me to call her next Thursday to check in on how  she was feeling about testing after having some time to process the information that was presented today and talk through her options more with her loved ones.  Ms. Hepner and her partner were also uncertain about whether or not they wanted to pursue partner carrier screening for alpha-thalassemia given that their risk of having a child with HbH disease is <1%. Ms. Lamba partner has Medicaid which would make testing free for him; however, he was unsure where his insurance card  was located. He will look for his Medicaid card before next Thursday and the couple will have a further discussion about whether or not this is something they want to pursue. If they desire testing, I can place the order and facilitate sample collection from there.   I counseled Ms. Mealey regarding the above risks and available options. The approximate face-to-face time with the genetic counselor was 60 minutes.  In summary:  Discussed NIPS result  Atypical finding on sex chromosomes  Not specified whether change is thought to be maternal or placental/fetal in origin  Reduction in risk for Down syndrome, trisomy 73, trisomy 73, triploidy, and 22q11.2 deletion syndrome  Reviewed carrier screening results  Silent carrier for alpha-thalassemia  Will consider partner carrier screening  Offered additional testing and screening  May be interested in pursuing amniocentesis and/or maternal karyotype. Requested more time to consider her options. I will call patient next Thursday to check in   Reviewed family history concerns   Gershon Crane, MS, [facility] Genetic Counselor

## 2020-11-25 ENCOUNTER — Ambulatory Visit (INDEPENDENT_AMBULATORY_CARE_PROVIDER_SITE_OTHER): Payer: Medicaid Other

## 2020-11-25 VITALS — BP 134/78 | HR 101 | Wt 156.0 lb

## 2020-11-25 DIAGNOSIS — Z3481 Encounter for supervision of other normal pregnancy, first trimester: Secondary | ICD-10-CM | POA: Diagnosis not present

## 2020-11-25 DIAGNOSIS — Z3A15 15 weeks gestation of pregnancy: Secondary | ICD-10-CM | POA: Diagnosis not present

## 2020-11-25 NOTE — Progress Notes (Signed)
+   Fetal movement. No complaints. AFP today.  

## 2020-11-26 NOTE — Progress Notes (Signed)
   HIGH-RISK PREGNANCY OFFICE VISIT  Patient name: Tracy Waller MRN [id number]  Date of birth: 17-Jul-1991 Chief Complaint:   Routine Prenatal Visit  Subjective:   Tracy Waller is a 30 y.o. 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  Patient presents today without complaints today.  Patient denies vaginal concerns including abnormal discharge, leaking of fluid, and bleeding.  Contractions: Not present. Vag. Bleeding: None.  Movement: Absent.  Reviewed past medical,surgical, social, obstetrical and family history as well as problem list, medications and allergies.  Objective   Vitals:   11/25/20 1406  BP: 134/78  Pulse: (!) 101  Weight: 156 lb (70.8 kg)  Body mass index is 27.63 kg/m.  Total Weight Gain:3 lb (1.361 kg)         Physical Examination:   General appearance: Well appearing, and in no distress  Mental status: Alert, oriented to person, place, and time  Skin: Warm & dry  Cardiovascular: Normal heart rate noted  Respiratory: Normal respiratory effort, no distress  Abdomen: Soft, gravid, nontender, Fundal height not assessed.  Pelvic: Cervical exam deferred           Extremities: Edema: None  Fetal Status: Fetal Heart Rate (bpm): 156  Movement: Absent   No results found for this or any previous visit (from the past 24 hour(s)).  Assessment & Plan:  High-risk pregnancy of a 30 y.o., [id number] at [redacted]w[redacted]d with an Estimated Date of Delivery: 05/19/21   1. Encounter for supervision of normal pregnancy in multigravida in first  trimester -Anticipatory guidance for upcoming appts. -Patient to next appt in 5 weeks for a virtual visit. -Reviewed genetic screening. Informed that sex could not be provided. -Reassured that gender would be reviewed at anatomy US.  2. [redacted] weeks gestation of pregnancy -Doing well. -Discussed desire for TOL. -AFP today.    Meds: No orders of the defined types were placed in this encounter.  Labs/procedures today:  Lab Orders     AFP, Serum, Open Spina Bifida   Reviewed: Preterm labor symptoms and general obstetric precautions including but not limited to vaginal bleeding, contractions, leaking of fluid and fetal movement were reviewed in detail with the patient.  All questions were answered.  Follow-up: Return in about 5 weeks (around 12/30/2020) for Virtual LR-ROB.  Orders Placed This Encounter  Procedures  . AFP, Serum, Open Spina Bifida   Cherre Robins MSN, CNM 11/26/2020

## 2020-11-27 LAB — AFP, SERUM, OPEN SPINA BIFIDA
AFP MoM: 0.89
AFP Value: 28.4 ng/mL
Gest. Age on Collection Date: 15 weeks
Maternal Age At EDD: 29.6 yr
OSBR Risk 1 IN: 10000
Test Results:: NEGATIVE
Weight: 156 [lb_av]

## 2020-12-02 ENCOUNTER — Telehealth: Payer: Self-pay | Admitting: Genetic Counselor

## 2020-12-02 NOTE — Telephone Encounter (Signed)
I called Ms. Tracy Waller to check in on her as promised during our discussion last week. At Ms. Tracy Waller's genetic counseling appointment on 3/2, she requested more time to consider whether or not she wanted to pursue further testing following her Panorama noninvasive prenatal screening (NIPS) result that demonstrated an atypical finding on the sex chromosomes (see Genetic Counseling note for more details). Today, Ms. Tracy Waller informed me that she has decided not to pursue any further testing such as an amniocentesis or maternal karyotype. She informed me that she is feeling better about things after having some time to process the information she was given.   Her partner Tracy Waller was also close by, so I inquired if he had thought any more about whether or not he wanted to pursue partner carrier screening for alpha-thalassemia. Tracy Waller declined partner carrier screening.  Ms. Tracy Waller will return to Maternal Fetal Medicine on 3/31 for her anatomy ultrasound. I reminded her that testing remains an option throughout the end of her pregnancy should she change her mind. She confirmed that she had no further questions for me at this time.  Gershon Crane, MS, [facility] Genetic Counselor

## 2020-12-12 ENCOUNTER — Other Ambulatory Visit: Payer: Self-pay

## 2020-12-12 ENCOUNTER — Encounter (HOSPITAL_COMMUNITY): Payer: Self-pay | Admitting: Obstetrics & Gynecology

## 2020-12-12 ENCOUNTER — Inpatient Hospital Stay (HOSPITAL_COMMUNITY)
Admission: AD | Admit: 2020-12-12 | Discharge: 2020-12-12 | Disposition: A | Discharge status: Home / Self Care | Payer: Medicaid Other | Attending: Obstetrics & Gynecology | Admitting: Obstetrics & Gynecology

## 2020-12-12 DIAGNOSIS — Z8619 Personal history of other infectious and parasitic diseases: Secondary | ICD-10-CM | POA: Insufficient documentation

## 2020-12-12 DIAGNOSIS — Z3A17 17 weeks gestation of pregnancy: Secondary | ICD-10-CM | POA: Insufficient documentation

## 2020-12-12 DIAGNOSIS — O351XX0 Maternal care for (suspected) chromosomal abnormality in fetus, not applicable or unspecified: Secondary | ICD-10-CM | POA: Insufficient documentation

## 2020-12-12 DIAGNOSIS — O99891 Other specified diseases and conditions complicating pregnancy: Secondary | ICD-10-CM

## 2020-12-12 DIAGNOSIS — Z87891 Personal history of nicotine dependence: Secondary | ICD-10-CM | POA: Insufficient documentation

## 2020-12-12 DIAGNOSIS — R1114 Bilious vomiting: Secondary | ICD-10-CM | POA: Diagnosis not present

## 2020-12-12 DIAGNOSIS — O219 Vomiting of pregnancy, unspecified: Secondary | ICD-10-CM | POA: Insufficient documentation

## 2020-12-12 LAB — URINALYSIS, ROUTINE W REFLEX MICROSCOPIC
Bilirubin Urine: NEGATIVE
Glucose, UA: NEGATIVE mg/dL
Hgb urine dipstick: NEGATIVE
Ketones, ur: NEGATIVE mg/dL
Leukocytes,Ua: NEGATIVE
Nitrite: NEGATIVE
Protein, ur: NEGATIVE mg/dL
Specific Gravity, Urine: 1.016 (ref 1.005–1.030)
pH: 7 (ref 5.0–8.0)

## 2020-12-12 MED ORDER — ONDANSETRON 4 MG PO TBDP
8.0000 mg | ORAL_TABLET | Freq: Once | ORAL | Status: AC
  Administered 2020-12-12: 8 mg via ORAL
  Filled 2020-12-12: qty 2

## 2020-12-12 MED ORDER — ONDANSETRON 4 MG PO TBDP: 4.0000 mg | ORAL_TABLET | Freq: Three times a day (TID) | ORAL | 0 refills | Status: DC | PRN

## 2020-12-12 NOTE — MAU Provider Note (Signed)
History     CSN: [id number]  Arrival date and time: 12/12/20 0300   Event Date/Time   First Provider Initiated Contact with Patient 12/12/20 1030      Chief Complaint  Patient presents with  . Emesis   Tracy Waller is a 30 y.o. year old G13P3013 female at 34w3dweeks gestation who presents to MAU reporting she had 2 episodes of vomiting with bile and blood in the emesis. She reports that she did not eat any dinner last night. She did not take any medication for the vomiting. She reports she has never had any occurrence like this to happen to her in the past. She denies any abdominal pain, diarrhea or vaginal bleeding. [facility] Her pregnancy has been complicated by atypical sex chromosomes on NIPS, TOLAC and a h/o syphillis.   OB History    Gravida  5   Para  3   Term  3   Preterm      AB  1   Living  3     SAB  1   IAB      Ectopic      Multiple      Live Births  3           Past Medical History:  Diagnosis Date  . Hx of chlamydia infection 2017  . Hx of trichomoniasis 2017  . Syphilis     Past Surgical History:  Procedure Laterality Date  . CESAREAN SECTION      Family History  Problem Relation Age of Onset  . Asthma Father   . Hypertension Father   . Hypertension Mother   . Hypertension Sister     Social History   Tobacco Use  . Smoking status: Former Smoker    Types: Cigarettes  . Smokeless tobacco: Never Used  . Tobacco comment: last used about 2 weeks ago  Vaping Use  . Vaping Use: Never used  Substance Use Topics  . Alcohol use: Not Currently    Comment: occasionally  . Drug use: Yes    Types: Marijuana    Comment: daily    Allergies: No Known Allergies  Medications Prior to Admission  Medication Sig Dispense Refill Last Dose  . Prenatal Vit-Fe Fumarate-FA (PREPLUS) 27-1 MG TABS Take 1 tablet by mouth daily. 30 tablet 13 12/11/2020 at Unknown time  . Blood Pressure Monitoring (BLOOD PRESSURE  KIT) DEVI 1 kit by Does not apply route once a week. 1 each 0     Review of Systems  Constitutional: Negative.   HENT: Negative.   Eyes: Negative.   Respiratory: Negative.   Cardiovascular: Negative.   Gastrointestinal: Positive for vomiting (had 2 episodes of vomiting with bile and blood).  Endocrine: Negative.   Genitourinary: Negative.   Musculoskeletal: Negative.   Skin: Negative.   Allergic/Immunologic: Negative.   Neurological: Negative.   Hematological: Negative.   Psychiatric/Behavioral: Negative.    Physical Exam   Blood pressure 122/77, pulse 82, temperature 98.6 F (37 C), temperature source Oral, resp. rate 16, height 5' 3"  (1.6 m), weight 73.6 kg, last menstrual period 08/12/2020, SpO2 100 %.  Physical Exam Vitals and nursing note reviewed. Exam conducted with a chaperone present.  Constitutional:      Appearance: Normal appearance. She is normal weight.  Pulmonary:     Effort: Pulmonary effort is normal.  Abdominal:     Palpations: Abdomen is soft.  Genitourinary:    Comments: Not indicated Musculoskeletal:  General: Normal range of motion.  Skin:    General: Skin is warm and dry.  Neurological:     Mental Status: She is alert and oriented to person, place, and time.  Psychiatric:        Mood and Affect: Mood normal.        Behavior: Behavior normal.        Thought Content: Thought content normal.        Judgment: Judgment normal.    FHTs by doppler: 155 bpm MAU Course  Procedures  MDM CCUA Zofran 8 mg ODT -- no N/V " I feel sooo much better"  Results for orders placed or performed during the hospital encounter of 12/12/20 (from the past 24 hour(s))  Urinalysis, Routine w reflex microscopic Urine, Clean Catch     Status: None   Collection Time: 12/12/20 10:03 AM  Result Value Ref Range   Color, Urine YELLOW YELLOW   APPearance CLEAR CLEAR   Specific Gravity, Urine 1.016 1.005 - 1.030   pH 7.0 5.0 - 8.0   Glucose, UA NEGATIVE NEGATIVE  mg/dL   Hgb urine dipstick NEGATIVE NEGATIVE   Bilirubin Urine NEGATIVE NEGATIVE   Ketones, ur NEGATIVE NEGATIVE mg/dL   Protein, ur NEGATIVE NEGATIVE mg/dL   Nitrite NEGATIVE NEGATIVE   Leukocytes,Ua NEGATIVE NEGATIVE   Assessment and Plan  Bilious vomiting, presence of nausea not specified - only 2 episodes at 0900, none since - Plan: Discharge patient - Rx for Zofran 4 mg ODT every 8 hours - Information provided on morning sickness - Keep scheduled appt with [facility] on 12/23/2020 - Patient verbalized an understanding of the plan of care and agrees.    Laury Deep, CNM 12/12/2020, 10:30 AM

## 2020-12-12 NOTE — MAU Note (Signed)
Pt reports to mau with c/o 2 episodes of vomiting "bile with blood" pt denies abd pain, or diarrhea.  Denies vag bleeding.

## 2020-12-23 ENCOUNTER — Encounter: Payer: Self-pay | Admitting: *Deleted

## 2020-12-23 ENCOUNTER — Other Ambulatory Visit: Payer: Self-pay

## 2020-12-23 ENCOUNTER — Ambulatory Visit: Payer: Medicaid Other | Attending: Obstetrics & Gynecology

## 2020-12-23 ENCOUNTER — Ambulatory Visit: Payer: Medicaid Other | Admitting: *Deleted

## 2020-12-23 DIAGNOSIS — O289 Unspecified abnormal findings on antenatal screening of mother: Secondary | ICD-10-CM | POA: Diagnosis not present

## 2020-12-23 DIAGNOSIS — Z148 Genetic carrier of other disease: Secondary | ICD-10-CM

## 2020-12-23 DIAGNOSIS — O34219 Maternal care for unspecified type scar from previous cesarean delivery: Secondary | ICD-10-CM | POA: Diagnosis not present

## 2020-12-23 DIAGNOSIS — Z363 Encounter for antenatal screening for malformations: Secondary | ICD-10-CM

## 2020-12-23 DIAGNOSIS — Z3A19 19 weeks gestation of pregnancy: Secondary | ICD-10-CM

## 2020-12-23 DIAGNOSIS — Z3481 Encounter for supervision of other normal pregnancy, first trimester: Secondary | ICD-10-CM | POA: Diagnosis present

## 2020-12-23 DIAGNOSIS — R898 Other abnormal findings in specimens from other organs, systems and tissues: Secondary | ICD-10-CM | POA: Diagnosis present

## 2020-12-23 DIAGNOSIS — Q998 Other specified chromosome abnormalities: Secondary | ICD-10-CM | POA: Diagnosis present

## 2020-12-23 DIAGNOSIS — Z8759 Personal history of other complications of pregnancy, childbirth and the puerperium: Secondary | ICD-10-CM

## 2020-12-30 ENCOUNTER — Other Ambulatory Visit: Payer: Self-pay

## 2020-12-30 ENCOUNTER — Other Ambulatory Visit (HOSPITAL_COMMUNITY)
Admission: RE | Admit: 2020-12-30 | Discharge: 2020-12-30 | Disposition: A | Discharge status: Home / Self Care | Payer: Medicaid Other | Source: Ambulatory Visit | Attending: Obstetrics and Gynecology | Admitting: Obstetrics and Gynecology

## 2020-12-30 ENCOUNTER — Ambulatory Visit (INDEPENDENT_AMBULATORY_CARE_PROVIDER_SITE_OTHER): Payer: Medicaid Other | Admitting: Obstetrics and Gynecology

## 2020-12-30 VITALS — BP 129/74 | HR 96 | Wt 162.0 lb

## 2020-12-30 DIAGNOSIS — Z202 Contact with and (suspected) exposure to infections with a predominantly sexual mode of transmission: Secondary | ICD-10-CM | POA: Insufficient documentation

## 2020-12-30 DIAGNOSIS — B009 Herpesviral infection, unspecified: Secondary | ICD-10-CM

## 2020-12-30 DIAGNOSIS — O98512 Other viral diseases complicating pregnancy, second trimester: Secondary | ICD-10-CM

## 2020-12-30 DIAGNOSIS — Z3481 Encounter for supervision of other normal pregnancy, first trimester: Secondary | ICD-10-CM

## 2020-12-30 DIAGNOSIS — D563 Thalassemia minor: Secondary | ICD-10-CM

## 2020-12-30 MED ORDER — VALACYCLOVIR HCL 1 G PO TABS: 1000.0000 mg | ORAL_TABLET | Freq: Two times a day (BID) | ORAL | 0 refills | Status: AC

## 2020-12-30 NOTE — Progress Notes (Signed)
+   Fetal movement. Pt c/o blister like area on the right side of vaginal area that is painful.

## 2020-12-30 NOTE — Patient Instructions (Signed)
[email or web address]">  Genital Herpes Genital herpes is a common sexually transmitted infection (STI) that is caused by a virus. The virus spreads from person to person through sexual contact. The infection can cause itching, blisters, and sores around the genitals or rectum. Symptoms may last for several days and then go away. This is called an outbreak. The virus remains in the body, however, so more outbreaks may happen in the future. The time between outbreaks varies and can be from months to years. Genital herpes can affect anyone. It is particularly concerning for pregnant women because the virus can be passed to the baby during delivery and can cause serious problems. Genital herpes is also a concern for people who have a weak disease-fighting system (immune system). What are the causes? This condition is caused by the human herpesvirus, also called herpes simplex virus, type 1 or type 2 (HSV-1 or HSV-2). The virus may spread through:  Sexual contact with an infected person, including vaginal, anal, and oral sex.  Contact with fluid from a herpes sore.  The skin. This means that you can get herpes from an infected partner even if there are no blisters or sores present. Your partner may not know that he or she is infected. What increases the risk? You are more likely to develop this condition if:  You have sex with many partners.  You do not use latex condoms during sex. What are the signs or symptoms? Most people do not have symptoms (are asymptomatic), or they have mild symptoms that may be mistaken for other skin problems. Symptoms may include:  Small, red bumps near the genitals, rectum, or mouth. These bumps turn into blisters and then sores.  Flu-like symptoms, including: ? Fever. ? Body aches. ? Swollen lymph nodes. ? Headache.  Painful urination.  Pain and itching in the genital area or rectal  area.  Vaginal discharge.  Tingling or shooting pain in the legs and buttocks. Generally, symptoms are more severe and last longer during the first (primary) outbreak. Flu-like symptoms are also more common during the primary outbreak.   How is this diagnosed? This condition may be diagnosed based on:  A physical exam.  Your medical history.  Blood tests.  A test of a fluid sample (culture) from an open sore. How is this treated? There is no cure for this condition, but treatment with antiviral medicines that are taken by mouth (orally) can do the following:  Speed up healing and relieve symptoms.  Help to reduce the spread of the virus to sexual partners.  Limit the chance of future outbreaks, or make future outbreaks shorter.  Lessen symptoms of future outbreaks. Your health care provider may also recommend pain relief medicines, such as aspirin or ibuprofen. Follow these instructions at home: If you have an outbreak:  Keep the affected areas dry and clean.  Avoid rubbing or touching blisters and sores. If you do touch blisters or sores: ? Wash your hands thoroughly with soap and water. ? Do not touch your eyes afterward.  To help relieve pain or itching, you may take the following actions as told by your health care provider: ? Apply a cold, wet cloth (cold compress) to affected areas 4-6 times a day. ? Apply a substance that protects your skin and reduces bleeding (astringent). ? Apply a gel that helps relieve pain around sores (lidocaine gel). ? Take a warm, shallow bath that cleans the genital area (sitz bath). Sexual activity  Do not have sexual contact during   active outbreaks.  Practice safe sex. Herpes can spread even if your partner does not have blisters or sores. Latex condoms and female condoms may help prevent the spread of the herpes virus. General instructions  Take over-the-counter and prescription medicines only as told by your health care  provider.  Keep all follow-up visits as told by your health care provider. This is important. How is this prevented?  Use condoms. Although you can get genital herpes during sexual contact even with the use of a condom, a condom can provide some protection.  Avoid having multiple sexual partners.  Talk with your sexual partner about any symptoms either of you may have. Also, talk with your partner about any history of STIs.  Get tested for STIs before you have sex. Ask your partner to do the same.  Do not have sexual contact if you have active symptoms of genital herpes. Contact a health care provider if:  Your symptoms are not improving with medicine.  Your symptoms return, or you have new symptoms.  You have a fever.  You have abdominal pain.  You have redness, swelling, or pain in your eye.  You notice new sores on other parts of your body.  You are a woman and you experience bleeding between menstrual periods.  You have had herpes and you become pregnant or plan to become pregnant. Summary  Genital herpes is a common sexually transmitted infection (STI) that is caused by the herpes simplex virus, type 1 or type 2 (HSV-1 or HSV-2).  These viruses are most often spread through sexual contact with an infected person.  You are more likely to develop this condition if you have sex with many partners or you do not use condoms during sex.  Most people do not have symptoms (are asymptomatic) or have mild symptoms that may be mistaken for other skin problems. Symptoms occur as outbreaks that may happen months or years apart.  There is no cure for this condition, but treatment with oral antiviral medicines can reduce symptoms, reduce the chance of spreading the virus to a partner, prevent future outbreaks, or shorten future outbreaks. This information is not intended to replace advice given to you by your health care provider. Make sure you discuss any questions you have with your  health care provider. Document Revised: 05/22/2019 Document Reviewed: 05/22/2019 Elsevier Patient Education  2021 Elsevier Inc.  

## 2020-12-30 NOTE — Progress Notes (Signed)
   PRENATAL VISIT NOTE  Subjective:  Tracy Waller is a 30 y.o. [phone] at [redacted]w[redacted]d being seen today for ongoing prenatal care.  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  Patient reports labial sore/burns with urination. Significant pain. .  Contractions: Not present. Vag. Bleeding: None.  Movement: Present. Denies leaking of fluid.   The following portions of the patient's history were reviewed and updated as appropriate: allergies, current medications, past family history, past medical history, past social history, past surgical history and problem list.   Objective:   Vitals:   12/30/20 1346  BP: 129/74  Pulse: 96  Weight: 162 lb (73.5 kg)    Fetal Status:     Movement: Present     General:  Alert, oriented and cooperative. Patient is in no acute distress.  Skin: Skin is warm and dry. No rash noted.   Cardiovascular: Normal heart rate noted  Respiratory: Normal respiratory effort, no problems with respiration noted  Abdomen: Soft, gravid, appropriate for gestational age.  Pain/Pressure: Absent     Pelvic: Cervical exam deferred       2 herpetic lesions noted on right labia minora. Hsv swab collected, tender with collection.   Extremities: Normal range of motion.  Edema: None  Mental Status: Normal mood and affect. Normal behavior. Normal judgment and thought content.   Assessment and Plan:  Pregnancy: [id number] at [redacted]w[redacted]d  1. Exposure to STD  - Cervicovaginal  ancillary only( [place]) - Herpes simplex virus culture  2. Encounter for supervision of normal pregnancy in multigravida in first trimester  Doing well, no pregnancy complaints. AFP negative  Problem list updated.  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 Please refer to After Visit Summary for other counseling recommendations.   Return in about 4 weeks (around 01/27/2021).  No future appointments.  Venia Carbon, NP

## 2020-12-31 ENCOUNTER — Encounter: Payer: Self-pay | Admitting: Obstetrics and Gynecology

## 2020-12-31 ENCOUNTER — Other Ambulatory Visit: Payer: Self-pay | Admitting: Obstetrics and Gynecology

## 2020-12-31 LAB — CERVICOVAGINAL ANCILLARY ONLY
Chlamydia: NEGATIVE
Comment: NEGATIVE
Comment: NEGATIVE
Comment: NORMAL
Neisseria Gonorrhea: NEGATIVE
Trichomonas: POSITIVE — AB

## 2020-12-31 MED ORDER — METRONIDAZOLE 500 MG PO TABS: 500.0000 mg | ORAL_TABLET | Freq: Two times a day (BID) | ORAL | 0 refills | Status: AC

## 2020-12-31 NOTE — Progress Notes (Signed)
+   trichomonas.  Rx: Flagyl x 7 days.  My chart message sent   Depaul Arizpe, Harolyn Rutherford, NP 12/31/2020 4:02 PM

## 2021-01-02 LAB — HERPES SIMPLEX VIRUS CULTURE

## 2021-01-21 ENCOUNTER — Ambulatory Visit: Payer: Medicaid Other

## 2021-01-21 ENCOUNTER — Telehealth: Payer: Self-pay

## 2021-01-21 NOTE — Telephone Encounter (Signed)
Mar/LM for patient (to r/s to Monday due to staffing emergency). 

## 2021-01-24 ENCOUNTER — Encounter: Payer: Self-pay | Admitting: *Deleted

## 2021-01-24 ENCOUNTER — Ambulatory Visit: Payer: Medicaid Other | Admitting: *Deleted

## 2021-01-24 ENCOUNTER — Other Ambulatory Visit: Payer: Self-pay

## 2021-01-24 ENCOUNTER — Ambulatory Visit: Payer: Medicaid Other | Attending: Obstetrics and Gynecology

## 2021-01-24 DIAGNOSIS — D563 Thalassemia minor: Secondary | ICD-10-CM | POA: Diagnosis present

## 2021-01-24 DIAGNOSIS — Q998 Other specified chromosome abnormalities: Secondary | ICD-10-CM | POA: Diagnosis present

## 2021-01-24 DIAGNOSIS — Z3481 Encounter for supervision of other normal pregnancy, first trimester: Secondary | ICD-10-CM

## 2021-01-25 ENCOUNTER — Other Ambulatory Visit: Payer: Self-pay | Admitting: *Deleted

## 2021-01-25 DIAGNOSIS — O280 Abnormal hematological finding on antenatal screening of mother: Secondary | ICD-10-CM

## 2021-01-27 ENCOUNTER — Encounter: Payer: Medicaid Other | Admitting: Nurse Practitioner

## 2021-02-09 ENCOUNTER — Encounter: Payer: Medicaid Other | Admitting: Women's Health

## 2021-03-02 ENCOUNTER — Other Ambulatory Visit: Payer: Self-pay

## 2021-03-02 ENCOUNTER — Encounter: Payer: Self-pay | Admitting: Obstetrics

## 2021-03-02 ENCOUNTER — Ambulatory Visit (INDEPENDENT_AMBULATORY_CARE_PROVIDER_SITE_OTHER): Payer: Medicaid Other | Admitting: Obstetrics

## 2021-03-02 VITALS — BP 131/80 | HR 97 | Wt 166.0 lb

## 2021-03-02 DIAGNOSIS — R898 Other abnormal findings in specimens from other organs, systems and tissues: Secondary | ICD-10-CM

## 2021-03-02 DIAGNOSIS — Z3481 Encounter for supervision of other normal pregnancy, first trimester: Secondary | ICD-10-CM

## 2021-03-02 DIAGNOSIS — O34219 Maternal care for unspecified type scar from previous cesarean delivery: Secondary | ICD-10-CM

## 2021-03-02 DIAGNOSIS — O98512 Other viral diseases complicating pregnancy, second trimester: Secondary | ICD-10-CM

## 2021-03-02 DIAGNOSIS — Z98891 History of uterine scar from previous surgery: Secondary | ICD-10-CM

## 2021-03-02 DIAGNOSIS — F32A Depression, unspecified: Secondary | ICD-10-CM

## 2021-03-02 DIAGNOSIS — B009 Herpesviral infection, unspecified: Secondary | ICD-10-CM

## 2021-03-02 DIAGNOSIS — Z8619 Personal history of other infectious and parasitic diseases: Secondary | ICD-10-CM

## 2021-03-02 DIAGNOSIS — O99340 Other mental disorders complicating pregnancy, unspecified trimester: Secondary | ICD-10-CM

## 2021-03-02 NOTE — Progress Notes (Addendum)
ROB, declined TDAP. Patient is not fasting today, needs to be scheduled for GTT.  PHQ-9=7 GAD-7=3

## 2021-03-02 NOTE — Progress Notes (Signed)
Subjective:  Tracy Waller is a 30 y.o. [phone] at [redacted]w[redacted]d being seen today for ongoing prenatal care.  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  Patient reports no complaints.  Contractions: Not present. Vag. Bleeding: None.  Movement: Present. Denies leaking of fluid.   The following portions of the patient's history were reviewed and updated as appropriate: allergies, current medications, past family history, past medical history, past social history, past surgical history and problem list. Problem list updated.  Objective:   Vitals:   03/02/21 1003  BP: 131/80  Pulse: 97  Weight: 166 lb (75.3 kg)    Fetal Status: Fetal Heart Rate (bpm): 152   Movement: Present     General:  Alert, oriented and cooperative. Patient is in no acute distress.  Skin: Skin is warm and dry. No rash noted.   Cardiovascular: Normal heart rate noted  Respiratory: Normal respiratory effort, no problems with respiration noted  Abdomen: Soft, gravid, appropriate for gestational age. Pain/Pressure: Absent     Pelvic:  Cervical exam deferred        Extremities: Normal range of motion.  Edema: Trace  Mental Status: Normal mood and affect. Normal behavior. Normal judgment and thought content.   Urinalysis:      Assessment and Plan:  Pregnancy: [id number] at [redacted]w[redacted]d  1. Encounter for supervision of normal pregnancy in multigravida in first trimester  2. H/O cesarean section  3. Desires VBAC (vaginal birth  after cesarean) trial - has had a successful VBAC - VBAC consent discussed and signed today  4. Depression affecting pregnancy Rx: - Ambulatory referral to [facility]  5. 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 History of syphilis, treated - clinically convalescent  7. Abnormal chromosomal test - ultrasound is normal   Preterm labor symptoms and general obstetric precautions including but not limited to vaginal bleeding, contractions, leaking of fluid and fetal movement were reviewed in detail with the patient. Please refer to After Visit Summary for other counseling recommendations.   Return in about 2 weeks (around 03/16/2021) for ROB.   Brock Bad, MD  03/02/21

## 2021-03-17 ENCOUNTER — Telehealth: Payer: Self-pay | Admitting: Licensed Clinical Social Worker

## 2021-03-17 ENCOUNTER — Other Ambulatory Visit: Payer: Medicaid Other

## 2021-03-17 ENCOUNTER — Encounter: Payer: Medicaid Other | Admitting: Licensed Clinical Social Worker

## 2021-03-17 ENCOUNTER — Encounter: Payer: Medicaid Other | Admitting: Obstetrics

## 2021-03-17 NOTE — Telephone Encounter (Signed)
Called pt regarding schedule mychart visit. Unable to leave voicemail phone keeps ringing. Mychart link sent to pt cell phone as well.

## 2021-03-21 ENCOUNTER — Ambulatory Visit: Payer: Medicaid Other | Attending: Obstetrics and Gynecology

## 2021-03-21 ENCOUNTER — Ambulatory Visit: Payer: Medicaid Other

## 2021-04-06 ENCOUNTER — Other Ambulatory Visit: Payer: Self-pay

## 2021-04-06 ENCOUNTER — Ambulatory Visit (INDEPENDENT_AMBULATORY_CARE_PROVIDER_SITE_OTHER): Payer: Medicaid Other | Admitting: Obstetrics

## 2021-04-06 ENCOUNTER — Encounter: Payer: Self-pay | Admitting: Obstetrics

## 2021-04-06 ENCOUNTER — Other Ambulatory Visit: Payer: Medicaid Other

## 2021-04-06 VITALS — BP 127/77 | HR 86 | Wt 170.0 lb

## 2021-04-06 DIAGNOSIS — A6000 Herpesviral infection of urogenital system, unspecified: Secondary | ICD-10-CM

## 2021-04-06 DIAGNOSIS — Z3481 Encounter for supervision of other normal pregnancy, first trimester: Secondary | ICD-10-CM

## 2021-04-06 DIAGNOSIS — Q998 Other specified chromosome abnormalities: Secondary | ICD-10-CM

## 2021-04-06 DIAGNOSIS — D563 Thalassemia minor: Secondary | ICD-10-CM

## 2021-04-06 MED ORDER — VALACYCLOVIR HCL 1 G PO TABS: 1000.0000 mg | ORAL_TABLET | Freq: Every day | ORAL | 11 refills | Status: DC

## 2021-04-06 NOTE — Progress Notes (Signed)
Subjective:  Tracy Waller is a 30 y.o. [phone] at [redacted]w[redacted]d being seen today for ongoing prenatal care.  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  Patient reports  pelvic pressure .  Contractions: Irregular. Vag. Bleeding: None.  Movement: Present. Denies leaking of fluid.   The following portions of the patient's history were reviewed and updated as appropriate: allergies, current medications, past family history, past medical history, past social history, past surgical history and problem list. Problem list updated.  Objective:   Vitals:   04/06/21 0922  BP: 127/77  Pulse: 86  Weight: 170 lb (77.1 kg)    Fetal Status:     Movement: Present     General:  Alert, oriented and cooperative. Patient is in no acute distress.  Skin: Skin is warm and dry. No rash noted.   Cardiovascular: Normal heart rate noted  Respiratory: Normal respiratory effort, no problems with respiration noted  Abdomen: Soft, gravid, appropriate for gestational age. Pain/Pressure: Present     Pelvic:  Cervical exam deferred        Extremities: Normal range of motion.     Mental Status: Normal mood and affect. Normal behavior. Normal judgment and thought content.   Urinalysis:      Assessment and Plan:  Pregnancy: [id number] at [redacted]w[redacted]d  1. Encounter for supervision of normal pregnancy in multigravida in first trimester Rx: - Glucose Tolerance, 2 Hours w/1 Hour - CBC - HIV Antibody (routine testing w rflx) -  RPR  2. Sex chromosome abnormalities  3. Alpha thalassemia trait  4. Herpes simplex infection of genitourinary system Rx: - valACYclovir (VALTREX) 1000 MG tablet; Take 1 tablet (1,000 mg total) by mouth daily.  Dispense: 30 tablet; Refill: 11   Preterm labor symptoms and general obstetric precautions including but not limited to vaginal bleeding, contractions, leaking of fluid and fetal movement were reviewed in detail with the patient. Please refer to After Visit Summary for other counseling recommendations.   Return in about 2 weeks (around 04/20/2021) for ROB.   Brock Bad, MD  04/06/21

## 2021-04-08 ENCOUNTER — Other Ambulatory Visit: Payer: Self-pay

## 2021-04-08 ENCOUNTER — Ambulatory Visit: Payer: Medicaid Other | Admitting: *Deleted

## 2021-04-08 ENCOUNTER — Encounter: Payer: Self-pay | Admitting: *Deleted

## 2021-04-08 ENCOUNTER — Ambulatory Visit: Payer: Medicaid Other | Attending: Obstetrics and Gynecology

## 2021-04-08 VITALS — BP 134/78 | HR 107

## 2021-04-08 DIAGNOSIS — O289 Unspecified abnormal findings on antenatal screening of mother: Secondary | ICD-10-CM | POA: Diagnosis not present

## 2021-04-08 DIAGNOSIS — Z3A34 34 weeks gestation of pregnancy: Secondary | ICD-10-CM

## 2021-04-08 DIAGNOSIS — Q998 Other specified chromosome abnormalities: Secondary | ICD-10-CM

## 2021-04-08 DIAGNOSIS — Z3481 Encounter for supervision of other normal pregnancy, first trimester: Secondary | ICD-10-CM | POA: Insufficient documentation

## 2021-04-08 DIAGNOSIS — O34219 Maternal care for unspecified type scar from previous cesarean delivery: Secondary | ICD-10-CM | POA: Diagnosis not present

## 2021-04-08 DIAGNOSIS — Z148 Genetic carrier of other disease: Secondary | ICD-10-CM

## 2021-04-08 DIAGNOSIS — O280 Abnormal hematological finding on antenatal screening of mother: Secondary | ICD-10-CM | POA: Diagnosis present

## 2021-04-08 DIAGNOSIS — D563 Thalassemia minor: Secondary | ICD-10-CM | POA: Insufficient documentation

## 2021-04-08 DIAGNOSIS — Z8759 Personal history of other complications of pregnancy, childbirth and the puerperium: Secondary | ICD-10-CM

## 2021-04-09 LAB — GLUCOSE TOLERANCE, 2 HOURS W/ 1HR
Glucose, 1 hour: 116 mg/dL (ref 65–179)
Glucose, 2 hour: 97 mg/dL (ref 65–152)
Glucose, Fasting: 76 mg/dL (ref 65–91)

## 2021-04-09 LAB — CBC
Hematocrit: 32 % — ABNORMAL LOW (ref 34.0–46.6)
Hemoglobin: 10.3 g/dL — ABNORMAL LOW (ref 11.1–15.9)
MCH: 27.1 pg (ref 26.6–33.0)
MCHC: 32.2 g/dL (ref 31.5–35.7)
MCV: 84 fL (ref 79–97)
Platelets: 241 10*3/uL (ref 150–450)
RBC: 3.8 x10E6/uL (ref 3.77–5.28)
RDW: 13.7 % (ref 11.7–15.4)
WBC: 11.8 10*3/uL — ABNORMAL HIGH (ref 3.4–10.8)

## 2021-04-09 LAB — HIV ANTIBODY (ROUTINE TESTING W REFLEX): HIV Screen 4th Generation wRfx: NONREACTIVE

## 2021-04-09 LAB — RPR: RPR Ser Ql: REACTIVE — AB

## 2021-04-09 LAB — RPR, QUANT+TP ABS (REFLEX)
Rapid Plasma Reagin, Quant: 1:1 {titer} — ABNORMAL HIGH
T Pallidum Abs: REACTIVE — AB

## 2021-04-20 ENCOUNTER — Ambulatory Visit (INDEPENDENT_AMBULATORY_CARE_PROVIDER_SITE_OTHER): Payer: Medicaid Other | Admitting: Obstetrics

## 2021-04-20 ENCOUNTER — Other Ambulatory Visit: Payer: Self-pay

## 2021-04-20 ENCOUNTER — Encounter: Payer: Self-pay | Admitting: Obstetrics

## 2021-04-20 VITALS — BP 127/77 | HR 99 | Wt 172.6 lb

## 2021-04-20 DIAGNOSIS — Z3480 Encounter for supervision of other normal pregnancy, unspecified trimester: Secondary | ICD-10-CM

## 2021-04-20 DIAGNOSIS — O34219 Maternal care for unspecified type scar from previous cesarean delivery: Secondary | ICD-10-CM

## 2021-04-20 DIAGNOSIS — Z98891 History of uterine scar from previous surgery: Secondary | ICD-10-CM

## 2021-04-20 NOTE — Progress Notes (Signed)
Subjective:  Tracy Waller is a 30 y.o. [phone] at [redacted]w[redacted]d being seen today for ongoing prenatal care.  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  Patient reports no complaints.  Contractions: Irritability. Vag. Bleeding: None.  Movement: Present. Denies leaking of fluid.   The following portions of the patient's history were reviewed and updated as appropriate: allergies, current medications, past family history, past medical history, past social history, past surgical history and problem list. Problem list updated.  Objective:   Vitals:   04/20/21 1056  BP: 127/77  Pulse: 99  Weight: 172 lb 9.6 oz (78.3 kg)    Fetal Status: Fetal Heart Rate (bpm): 154   Movement: Present     General:  Alert, oriented and cooperative. Patient is in no acute distress.  Skin: Skin is warm and dry. No rash noted.   Cardiovascular: Normal heart rate noted  Respiratory: Normal respiratory effort, no problems with respiration noted  Abdomen: Soft, gravid, appropriate for gestational age. Pain/Pressure: Absent     Pelvic:  Cervical exam deferred        Extremities: Normal range of motion.  Edema: Trace  Mental Status: Normal mood and affect. Normal behavior. Normal judgment and thought content.   Urinalysis:      Assessment and Plan:  Pregnancy: [id number] at [redacted]w[redacted]d  1. Supervision of other normal pregnancy, antepartum  2. H/O cesarean section  3. Desires VBAC (vaginal birth after cesarean) trial -  VBAC consent signed 03-02-2021   Preterm labor symptoms and general obstetric precautions including but not limited to vaginal bleeding, contractions, leaking of fluid and fetal movement were reviewed in detail with the patient. Please refer to After Visit Summary for other counseling recommendations.   Return in about 1 week (around 04/27/2021) for ROB.   Brock Bad, MD  04/20/21

## 2021-04-20 NOTE — Progress Notes (Signed)
Pt presents for ROB c/o insomnia.

## 2021-04-27 ENCOUNTER — Other Ambulatory Visit: Payer: Self-pay

## 2021-04-27 ENCOUNTER — Other Ambulatory Visit (HOSPITAL_COMMUNITY)
Admission: RE | Admit: 2021-04-27 | Discharge: 2021-04-27 | Disposition: A | Discharge status: Home / Self Care | Payer: Medicaid Other | Source: Ambulatory Visit | Attending: Obstetrics | Admitting: Obstetrics

## 2021-04-27 ENCOUNTER — Encounter: Payer: Self-pay | Admitting: Obstetrics

## 2021-04-27 ENCOUNTER — Ambulatory Visit (INDEPENDENT_AMBULATORY_CARE_PROVIDER_SITE_OTHER): Payer: Medicaid Other | Admitting: Obstetrics

## 2021-04-27 VITALS — BP 117/72 | HR 108 | Wt 174.8 lb

## 2021-04-27 DIAGNOSIS — O34219 Maternal care for unspecified type scar from previous cesarean delivery: Secondary | ICD-10-CM

## 2021-04-27 DIAGNOSIS — Z98891 History of uterine scar from previous surgery: Secondary | ICD-10-CM

## 2021-04-27 DIAGNOSIS — Z3480 Encounter for supervision of other normal pregnancy, unspecified trimester: Secondary | ICD-10-CM | POA: Insufficient documentation

## 2021-04-27 NOTE — Progress Notes (Signed)
Pt presents for ROB, GBS, GC/CT.  

## 2021-04-27 NOTE — Progress Notes (Signed)
Subjective:  Tracy Waller is a 30 y.o. [phone] at [redacted]w[redacted]d being seen today for ongoing prenatal care.  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  Patient reports heartburn.  Contractions: Irritability. Vag. Bleeding: None.  Movement: Present. Denies leaking of fluid.   The following portions of the patient's history were reviewed and updated as appropriate: allergies, current medications, past family history, past medical history, past social history, past surgical history and problem list. Problem list updated.  Objective:   Vitals:   04/27/21 1606  BP: 117/72  Pulse: (!) 108  Weight: 174 lb 12.8 oz (79.3 kg)    Fetal Status:     Movement: Present     General:  Alert, oriented and cooperative. Patient is in no acute distress.  Skin: Skin is warm and dry. No rash noted.   Cardiovascular: Normal heart rate noted  Respiratory: Normal respiratory effort, no problems with respiration noted  Abdomen: Soft, gravid, appropriate for gestational age. Pain/Pressure: Absent     Pelvic:  Cervical exam deferred        Extremities: Normal range of motion.  Edema: Trace  Mental Status: Normal mood and affect. Normal behavior. Normal judgment and thought content.   Urinalysis:      Assessment and Plan:  Pregnancy: [id number] at [redacted]w[redacted]d  1. Supervision of other normal pregnancy, antepartum  2. H/O cesarean section-  3. Desires VBAC (vaginal birth after cesarean) trial   Term labor symptoms  and general obstetric precautions including but not limited to vaginal bleeding, contractions, leaking of fluid and fetal movement were reviewed in detail with the patient. Please refer to After Visit Summary for other counseling recommendations.   Return in about 1 week (around 05/04/2021) for ROB.   Brock Bad, MD  04/27/21

## 2021-04-29 ENCOUNTER — Other Ambulatory Visit: Payer: Self-pay | Admitting: Obstetrics

## 2021-04-29 LAB — CERVICOVAGINAL ANCILLARY ONLY
Bacterial Vaginitis (gardnerella): NEGATIVE
Candida Glabrata: NEGATIVE
Candida Vaginitis: POSITIVE — AB
Chlamydia: NEGATIVE
Comment: NEGATIVE
Comment: NEGATIVE
Comment: NEGATIVE
Comment: NEGATIVE
Comment: NEGATIVE
Comment: NORMAL
Neisseria Gonorrhea: NEGATIVE
Trichomonas: NEGATIVE

## 2021-04-29 LAB — STREP GP B NAA: Strep Gp B NAA: POSITIVE — AB

## 2021-05-04 ENCOUNTER — Ambulatory Visit (INDEPENDENT_AMBULATORY_CARE_PROVIDER_SITE_OTHER): Payer: Medicaid Other | Admitting: Obstetrics

## 2021-05-04 ENCOUNTER — Other Ambulatory Visit: Payer: Self-pay

## 2021-05-04 ENCOUNTER — Encounter: Payer: Self-pay | Admitting: Obstetrics

## 2021-05-04 VITALS — BP 125/81 | HR 101 | Wt 169.9 lb

## 2021-05-04 DIAGNOSIS — O34219 Maternal care for unspecified type scar from previous cesarean delivery: Secondary | ICD-10-CM

## 2021-05-04 DIAGNOSIS — Z3480 Encounter for supervision of other normal pregnancy, unspecified trimester: Secondary | ICD-10-CM

## 2021-05-04 DIAGNOSIS — B3731 Acute candidiasis of vulva and vagina: Secondary | ICD-10-CM

## 2021-05-04 DIAGNOSIS — Q998 Other specified chromosome abnormalities: Secondary | ICD-10-CM

## 2021-05-04 DIAGNOSIS — Z98891 History of uterine scar from previous surgery: Secondary | ICD-10-CM

## 2021-05-04 DIAGNOSIS — B373 Candidiasis of vulva and vagina: Secondary | ICD-10-CM

## 2021-05-04 MED ORDER — TERCONAZOLE 0.8 % VA CREA: 1.0000 | TOPICAL_CREAM | Freq: Every day | VAGINAL | 0 refills | Status: DC

## 2021-05-04 NOTE — Progress Notes (Signed)
Pt presents for ROB without complaints.  

## 2021-05-04 NOTE — Progress Notes (Signed)
Subjective:  Tracy Waller is a 30 y.o. [phone] at [redacted]w[redacted]d being seen today for ongoing prenatal care.  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  Patient reports occasional contractions.  Contractions: Irritability. Vag. Bleeding: None.  Movement: Present. Denies leaking of fluid.   The following portions of the patient's history were reviewed and updated as appropriate: allergies, current medications, past family history, past medical history, past social history, past surgical history and problem list. Problem list updated.  Objective:   Vitals:   05/04/21 1542  BP: 125/81  Pulse: (!) 101  Weight: 169 lb 14.4 oz (77.1 kg)    Fetal Status:     Movement: Present     General:  Alert, oriented and cooperative. Patient is in no acute distress.  Skin: Skin is warm and dry. No rash noted.   Cardiovascular: Normal heart rate noted  Respiratory: Normal respiratory effort, no problems with respiration noted  Abdomen: Soft, gravid, appropriate for gestational age. Pain/Pressure: Present     Pelvic:  Cervical exam deferred        Extremities: Normal range of motion.  Edema: Trace  Mental Status: Normal mood and affect. Normal behavior. Normal judgment and thought content.   Urinalysis:      Assessment and Plan:  Pregnancy: [id number] at [redacted]w[redacted]d  1. Supervision of other normal pregnancy, antepartum  2. H/O cesarean section  3. Desires VBAC (vaginal birth after cesarean) trial - consent  signed 03-02-2021  4. Sex chromosome abnormalities   Term labor symptoms and general obstetric precautions including but not limited to vaginal bleeding, contractions, leaking of fluid and fetal movement were reviewed in detail with the patient. Please refer to After Visit Summary for other counseling recommendations.   Return in about 1 week (around 05/11/2021) for ROB.   Brock Bad, MD  05/04/21

## 2021-05-11 ENCOUNTER — Ambulatory Visit (INDEPENDENT_AMBULATORY_CARE_PROVIDER_SITE_OTHER): Payer: Medicaid Other | Admitting: Obstetrics

## 2021-05-11 ENCOUNTER — Other Ambulatory Visit: Payer: Self-pay

## 2021-05-11 ENCOUNTER — Encounter: Payer: Self-pay | Admitting: Obstetrics

## 2021-05-11 VITALS — BP 123/76 | HR 106 | Wt 174.7 lb

## 2021-05-11 DIAGNOSIS — Q998 Other specified chromosome abnormalities: Secondary | ICD-10-CM

## 2021-05-11 DIAGNOSIS — Z8619 Personal history of other infectious and parasitic diseases: Secondary | ICD-10-CM

## 2021-05-11 DIAGNOSIS — A6000 Herpesviral infection of urogenital system, unspecified: Secondary | ICD-10-CM

## 2021-05-11 DIAGNOSIS — Z3480 Encounter for supervision of other normal pregnancy, unspecified trimester: Secondary | ICD-10-CM

## 2021-05-11 DIAGNOSIS — O34219 Maternal care for unspecified type scar from previous cesarean delivery: Secondary | ICD-10-CM

## 2021-05-11 DIAGNOSIS — Z98891 History of uterine scar from previous surgery: Secondary | ICD-10-CM

## 2021-05-11 NOTE — Progress Notes (Signed)
Pt presents for ROB no complaints.

## 2021-05-11 NOTE — Progress Notes (Addendum)
Subjective:  ELYSIA Waller is a 30 y.o. [phone] at [redacted]w[redacted]d being seen today for ongoing prenatal care.  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  Patient reports heartburn.  Contractions: Irritability. Vag. Bleeding: None.  Movement: Present. Denies leaking of fluid.   The following portions of the patient's history were reviewed and updated as appropriate: allergies, current medications, past family history, past medical history, past social history, past surgical history and problem list. Problem list updated.  Objective:   Vitals:   05/11/21 1538  BP: 123/76  Pulse: (!) 106  Weight: 174 lb 11.2 oz (79.2 kg)    Fetal Status:     Movement: Present     General:  Alert, oriented and cooperative. Patient is in no acute distress.  Skin: Skin is warm and dry. No rash noted.   Cardiovascular: Normal heart rate noted  Respiratory: Normal respiratory effort, no problems with respiration noted  Abdomen: Soft, gravid, appropriate for gestational age. Pain/Pressure: Present     Pelvic:  Cervical exam deferred        Extremities: Normal range of motion.  Edema: Trace  Mental Status: Normal mood and affect. Normal behavior. Normal judgment and thought content.   Urinalysis:      Assessment and Plan:  Pregnancy: [id number] at [redacted]w[redacted]d  1. Supervision of other normal pregnancy, antepartum  2. H/O cesarean section  3. Desires VBAC (vaginal birth after cesarean) trial - consent signed  4.  Sex chromosome abnormalities  5. History of syphilis  6. Herpes simplex infection of genitourinary system    Term labor symptoms and general obstetric precautions including but not limited to vaginal bleeding, contractions, leaking of fluid and fetal movement were reviewed in detail with the patient. Please refer to After Visit Summary for other counseling recommendations.   Return in about 1 week (around 05/18/2021) for ROB.   Brock Bad, MD 05/11/2021 4:03 PM

## 2021-05-18 ENCOUNTER — Ambulatory Visit (INDEPENDENT_AMBULATORY_CARE_PROVIDER_SITE_OTHER): Payer: Medicaid Other | Admitting: Obstetrics

## 2021-05-18 ENCOUNTER — Encounter: Payer: Self-pay | Admitting: Obstetrics

## 2021-05-18 ENCOUNTER — Other Ambulatory Visit: Payer: Self-pay

## 2021-05-18 VITALS — BP 125/78 | HR 94 | Wt 175.4 lb

## 2021-05-18 DIAGNOSIS — A6000 Herpesviral infection of urogenital system, unspecified: Secondary | ICD-10-CM

## 2021-05-18 DIAGNOSIS — O34219 Maternal care for unspecified type scar from previous cesarean delivery: Secondary | ICD-10-CM

## 2021-05-18 DIAGNOSIS — Z8619 Personal history of other infectious and parasitic diseases: Secondary | ICD-10-CM

## 2021-05-18 DIAGNOSIS — Z98891 History of uterine scar from previous surgery: Secondary | ICD-10-CM

## 2021-05-18 DIAGNOSIS — Z3480 Encounter for supervision of other normal pregnancy, unspecified trimester: Secondary | ICD-10-CM

## 2021-05-18 NOTE — Progress Notes (Signed)
ROB 39.6wks No complaints.

## 2021-05-18 NOTE — Progress Notes (Signed)
Subjective:  Tracy Waller is a 30 y.o. [phone] at [redacted]w[redacted]d being seen today for ongoing prenatal care.  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  Patient reports no complaints.  Contractions: Irritability. Vag. Bleeding: None.  Movement: Present. Denies leaking of fluid.   The following portions of the patient's history were reviewed and updated as appropriate: allergies, current medications, past family history, past medical history, past social history, past surgical history and problem list. Problem list updated.  Objective:   Vitals:   05/18/21 1533  BP: 125/78  Pulse: 94  Weight: 175 lb 6.4 oz (79.6 kg)    Fetal Status: Fetal Heart Rate (bpm): 155   Movement: Present     General:  Alert, oriented and cooperative. Patient is in no acute distress.  Skin: Skin is warm and dry. No rash noted.   Cardiovascular: Normal heart rate noted  Respiratory: Normal respiratory effort, no problems with respiration noted  Abdomen: Soft, gravid, appropriate for gestational age. Pain/Pressure: Present     Pelvic:  Cervical exam performed      Cvx:  2cm / 50 / -3 / Vtx  Extremities: Normal range of motion.  Edema: Trace  Mental Status: Normal mood and affect. Normal behavior. Normal judgment and thought content.   Urinalysis:      Assessment and Plan:  Pregnancy: [id number] at [redacted]w[redacted]d  1. Supervision of other normal pregnancy, antepartum  2. H/O cesarean section  3. Desires VBAC (vaginal  birth after cesarean) trial  4. Herpes simplex infection of genitourinary system - taking Valtrex suppression  5. History of syphilis - clinically stable   Term labor symptoms and general obstetric precautions including but not limited to vaginal bleeding, contractions, leaking of fluid and fetal movement were reviewed in detail with the patient. Please refer to After Visit Summary for other counseling recommendations.   Return in about 1 week (around 05/25/2021) for ROB.   Brock Bad, MD  05/18/21

## 2021-05-21 IMAGING — US US OB LIMITED
1 series · 7 of 7 positions shown · non-contrast
Comparison: none

[Series 1: us ob limited · 7 of 7 slices shown]
[im 1/7]
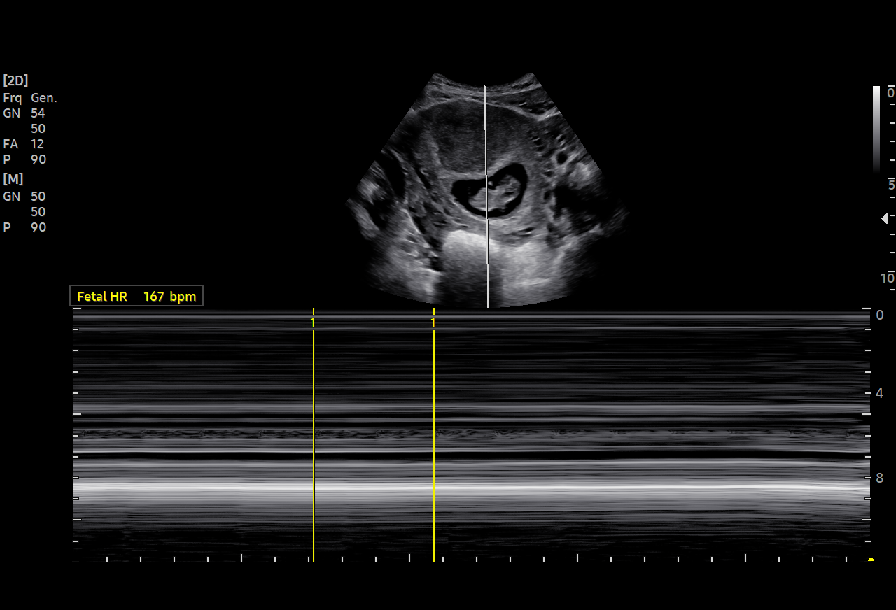
[im 2/7]
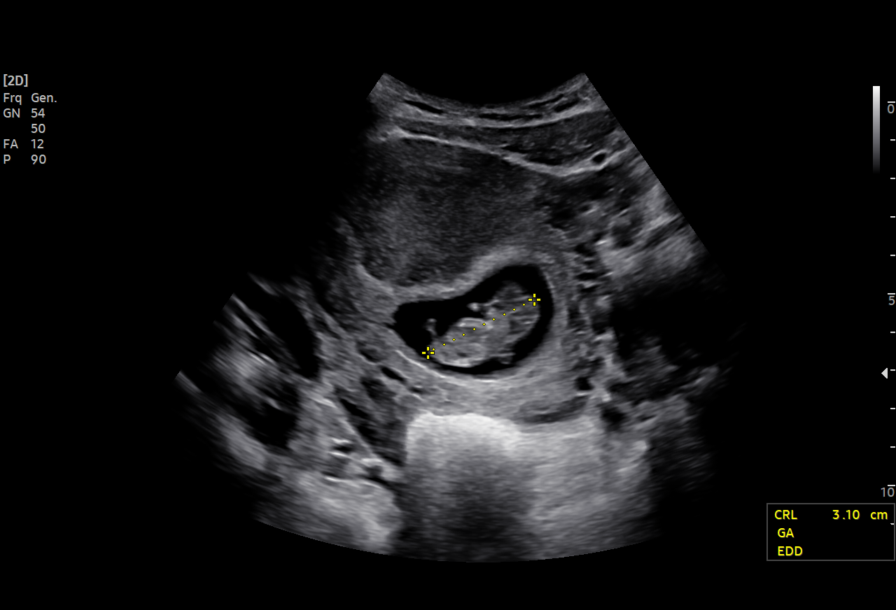
[im 3/7]
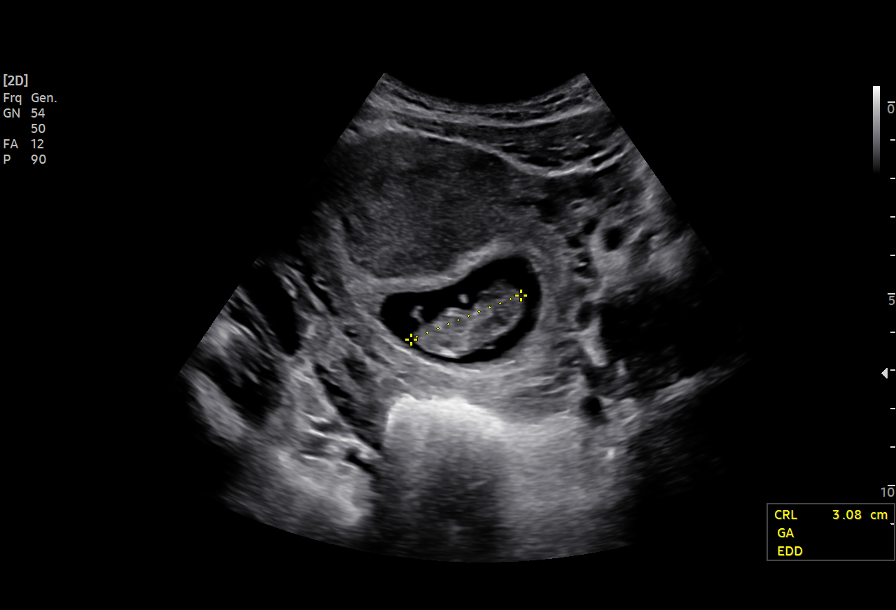
[im 4/7]
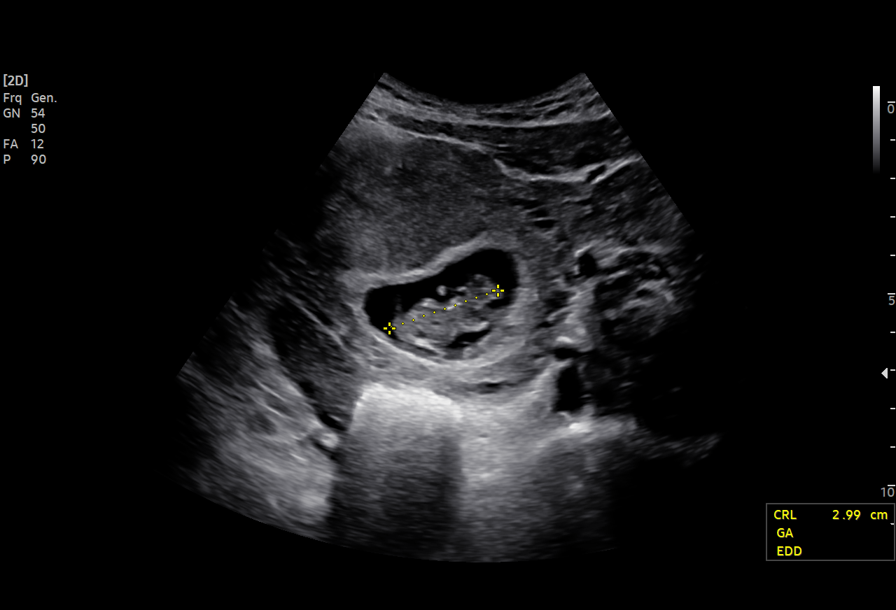
[im 5/7]
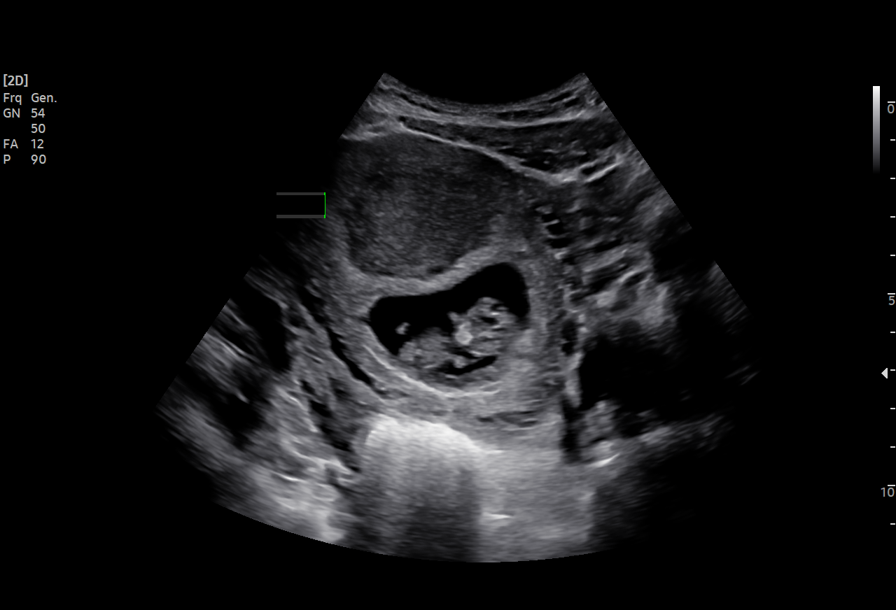
[im 6/7]
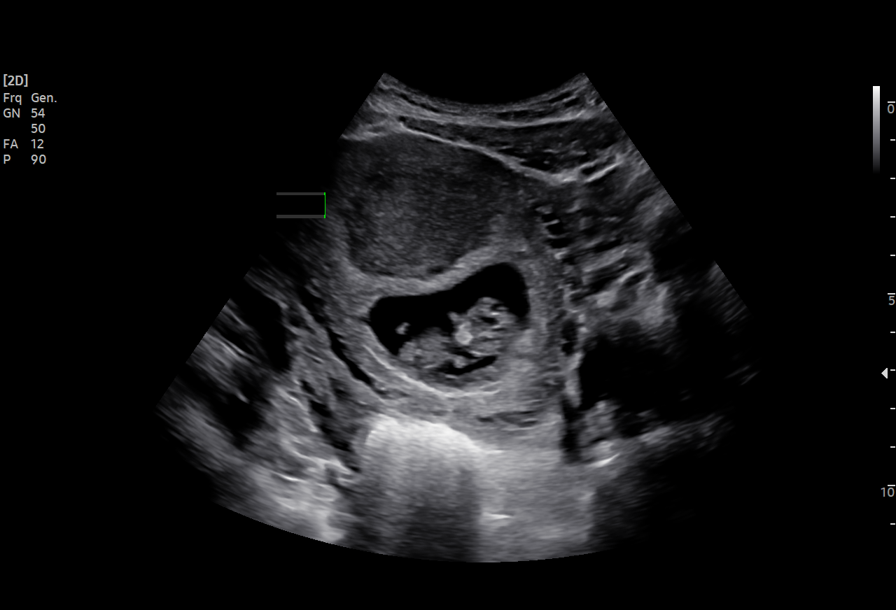
[im 7/7]
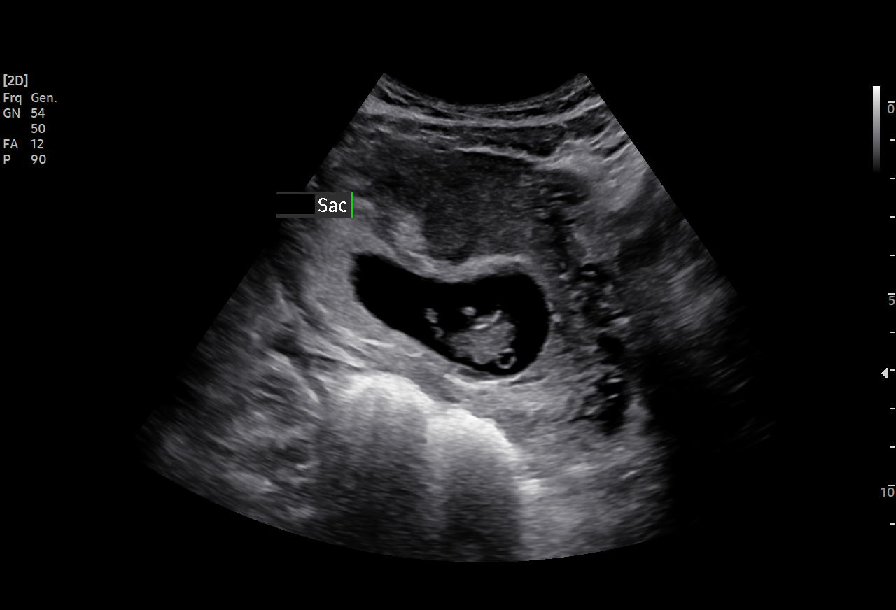

[7 of 7 positions shown; findings below may reference images not displayed]

Healthcare

 1   [HOSPITAL]                        [address].0      YO PATERSON

Indications

 9 weeks gestation of pregnancy
Fetal Evaluation

 Num Of Fetuses:          1
 Fetal Heart Rate(bpm):   167
 Cardiac Activity:        Observed
Biometry

 CRL:      30.6   mm     G. Age:  9w 6d                    EDD:   05/19/21
Gestational Age

 Best:           9w 6d     Det. By:  U/S C R L (10/20/20)       EDD:  05/19/21
Comments

 SIngle live IUP at 9w6d by CRL. LMP 08/12/20
Impression

 Single IUP with cardiac activity
Recommendations

 Routine prenatal care
 Anatomy scan at 18-20 weeks and additional U/S as indicated

## 2021-05-25 ENCOUNTER — Inpatient Hospital Stay (HOSPITAL_COMMUNITY)
Admission: AD | Admit: 2021-05-25 | Discharge: 2021-05-28 | DRG: 786 | Disposition: A | Discharge status: Home / Self Care | Payer: Medicaid Other | Attending: Obstetrics & Gynecology | Admitting: Obstetrics & Gynecology

## 2021-05-25 ENCOUNTER — Encounter: Payer: Self-pay | Admitting: Obstetrics

## 2021-05-25 ENCOUNTER — Encounter (HOSPITAL_COMMUNITY): Payer: Self-pay | Admitting: Family Medicine

## 2021-05-25 ENCOUNTER — Ambulatory Visit (INDEPENDENT_AMBULATORY_CARE_PROVIDER_SITE_OTHER): Payer: Medicaid Other | Admitting: Obstetrics

## 2021-05-25 ENCOUNTER — Other Ambulatory Visit: Payer: Self-pay

## 2021-05-25 VITALS — BP 125/79 | HR 75 | Wt 173.0 lb

## 2021-05-25 DIAGNOSIS — Z7251 High risk heterosexual behavior: Secondary | ICD-10-CM

## 2021-05-25 DIAGNOSIS — O9081 Anemia of the puerperium: Secondary | ICD-10-CM | POA: Diagnosis not present

## 2021-05-25 DIAGNOSIS — O480 Post-term pregnancy: Secondary | ICD-10-CM

## 2021-05-25 DIAGNOSIS — Z3A40 40 weeks gestation of pregnancy: Secondary | ICD-10-CM | POA: Diagnosis not present

## 2021-05-25 DIAGNOSIS — Z3A41 41 weeks gestation of pregnancy: Secondary | ICD-10-CM | POA: Diagnosis not present

## 2021-05-25 DIAGNOSIS — O99824 Streptococcus B carrier state complicating childbirth: Secondary | ICD-10-CM | POA: Diagnosis not present

## 2021-05-25 DIAGNOSIS — Z8619 Personal history of other infectious and parasitic diseases: Secondary | ICD-10-CM | POA: Diagnosis present

## 2021-05-25 DIAGNOSIS — D563 Thalassemia minor: Secondary | ICD-10-CM | POA: Diagnosis present

## 2021-05-25 DIAGNOSIS — Z98891 History of uterine scar from previous surgery: Secondary | ICD-10-CM

## 2021-05-25 DIAGNOSIS — D62 Acute posthemorrhagic anemia: Secondary | ICD-10-CM | POA: Diagnosis not present

## 2021-05-25 DIAGNOSIS — Z3481 Encounter for supervision of other normal pregnancy, first trimester: Secondary | ICD-10-CM

## 2021-05-25 DIAGNOSIS — O9832 Other infections with a predominantly sexual mode of transmission complicating childbirth: Secondary | ICD-10-CM | POA: Diagnosis not present

## 2021-05-25 DIAGNOSIS — Q998 Other specified chromosome abnormalities: Secondary | ICD-10-CM

## 2021-05-25 DIAGNOSIS — Q5039 Other congenital malformation of ovary: Secondary | ICD-10-CM

## 2021-05-25 DIAGNOSIS — A599 Trichomoniasis, unspecified: Secondary | ICD-10-CM | POA: Diagnosis present

## 2021-05-25 DIAGNOSIS — A6000 Herpesviral infection of urogenital system, unspecified: Secondary | ICD-10-CM | POA: Diagnosis not present

## 2021-05-25 DIAGNOSIS — O34211 Maternal care for low transverse scar from previous cesarean delivery: Secondary | ICD-10-CM | POA: Diagnosis not present

## 2021-05-25 DIAGNOSIS — Z87891 Personal history of nicotine dependence: Secondary | ICD-10-CM | POA: Diagnosis not present

## 2021-05-25 DIAGNOSIS — Z20822 Contact with and (suspected) exposure to covid-19: Secondary | ICD-10-CM | POA: Diagnosis not present

## 2021-05-25 DIAGNOSIS — Z8759 Personal history of other complications of pregnancy, childbirth and the puerperium: Secondary | ICD-10-CM

## 2021-05-25 DIAGNOSIS — T81509A Unspecified complication of foreign body accidentally left in body following unspecified procedure, initial encounter: Secondary | ICD-10-CM

## 2021-05-25 DIAGNOSIS — Z717 Human immunodeficiency virus [HIV] counseling: Secondary | ICD-10-CM

## 2021-05-25 HISTORY — DX: Chlamydial infection, unspecified: A74.9

## 2021-05-25 HISTORY — DX: Neurosyphilis, unspecified: A52.3

## 2021-05-25 LAB — COMPREHENSIVE METABOLIC PANEL
ALT: 14 U/L (ref 0–44)
AST: 21 U/L (ref 15–41)
Albumin: 3.2 g/dL — ABNORMAL LOW (ref 3.5–5.0)
Alkaline Phosphatase: 139 U/L — ABNORMAL HIGH (ref 38–126)
Anion gap: 12 (ref 5–15)
BUN: 6 mg/dL (ref 6–20)
CO2: 19 mmol/L — ABNORMAL LOW (ref 22–32)
Calcium: 9.2 mg/dL (ref 8.9–10.3)
Chloride: 102 mmol/L (ref 98–111)
Creatinine, Ser: 0.6 mg/dL (ref 0.44–1.00)
GFR, Estimated: >60 mL/min (ref >60)
Glucose, Bld: 99 mg/dL (ref 70–99)
Potassium: 3.6 mmol/L (ref 3.5–5.1)
Sodium: 133 mmol/L — ABNORMAL LOW (ref 135–145)
Total Bilirubin: 0.9 mg/dL (ref 0.3–1.2)
Total Protein: 7.3 g/dL (ref 6.5–8.1)

## 2021-05-25 LAB — RESP PANEL BY RT-PCR (FLU A&B, COVID) ARPGX2
Influenza A by PCR: NEGATIVE
Influenza B by PCR: NEGATIVE
SARS Coronavirus 2 by RT PCR: NEGATIVE

## 2021-05-25 LAB — CBC
HCT: 32.5 % — ABNORMAL LOW (ref 36.0–46.0)
Hemoglobin: 10.7 g/dL — ABNORMAL LOW (ref 12.0–15.0)
MCH: 27.4 pg (ref 26.0–34.0)
MCHC: 32.9 g/dL (ref 30.0–36.0)
MCV: 83.3 fL (ref 80.0–100.0)
Platelets: 214 10*3/uL (ref 150–400)
RBC: 3.9 MIL/uL (ref 3.87–5.11)
RDW: 14.1 % (ref 11.5–15.5)
WBC: 10.4 10*3/uL (ref 4.0–10.5)
nRBC: 0 % (ref 0.0–0.2)

## 2021-05-25 LAB — PROTEIN / CREATININE RATIO, URINE
Creatinine, Urine: 77.93 mg/dL
Protein Creatinine Ratio: 0.14 mg/mg{Cre} (ref 0.00–0.15)
Total Protein, Urine: 11 mg/dL

## 2021-05-25 LAB — TYPE AND SCREEN
ABO/RH(D): O POS
Antibody Screen: NEGATIVE

## 2021-05-25 MED ORDER — SODIUM CHLORIDE 0.9 % IV SOLN
5.0000 10*6.[IU] | Freq: Once | INTRAVENOUS | Status: AC
  Administered 2021-05-25: 5 10*6.[IU] via INTRAVENOUS
  Filled 2021-05-25: qty 5

## 2021-05-25 MED ORDER — LIDOCAINE HCL (PF) 1 % IJ SOLN: 30.0000 mL | INTRAMUSCULAR | Status: DC | PRN

## 2021-05-25 MED ORDER — OXYCODONE-ACETAMINOPHEN 5-325 MG PO TABS: 2.0000 | ORAL_TABLET | ORAL | Status: DC | PRN

## 2021-05-25 MED ORDER — ACETAMINOPHEN 325 MG PO TABS
650.0000 mg | ORAL_TABLET | ORAL | Status: DC | PRN
  Administered 2021-05-25 (×2): 650 mg via ORAL
  Filled 2021-05-25 (×2): qty 2

## 2021-05-25 MED ORDER — OXYTOCIN BOLUS FROM INFUSION: 333.0000 mL | Freq: Once | INTRAVENOUS | Status: DC

## 2021-05-25 MED ORDER — FENTANYL CITRATE (PF) 100 MCG/2ML IJ SOLN
100.0000 ug | INTRAMUSCULAR | Status: DC | PRN
  Administered 2021-05-25 (×2): 100 ug via INTRAVENOUS
  Filled 2021-05-25 (×2): qty 2

## 2021-05-25 MED ORDER — OXYTOCIN-SODIUM CHLORIDE 30-0.9 UT/500ML-% IV SOLN
1.0000 m[IU]/min | INTRAVENOUS | Status: DC
  Administered 2021-05-25: 2 m[IU]/min via INTRAVENOUS
  Filled 2021-05-25: qty 500

## 2021-05-25 MED ORDER — OXYTOCIN-SODIUM CHLORIDE 30-0.9 UT/500ML-% IV SOLN: 2.5000 [IU]/h | INTRAVENOUS | Status: DC

## 2021-05-25 MED ORDER — ONDANSETRON HCL 4 MG/2ML IJ SOLN
4.0000 mg | Freq: Four times a day (QID) | INTRAMUSCULAR | Status: DC | PRN
  Administered 2021-05-26: 4 mg via INTRAVENOUS
  Filled 2021-05-25: qty 2

## 2021-05-25 MED ORDER — LACTATED RINGERS IV SOLN
500.0000 mL | INTRAVENOUS | Status: DC | PRN
Start: 2021-05-25 — End: 2021-05-26

## 2021-05-25 MED ORDER — OXYCODONE-ACETAMINOPHEN 5-325 MG PO TABS: 1.0000 | ORAL_TABLET | ORAL | Status: DC | PRN

## 2021-05-25 MED ORDER — PENICILLIN G POT IN DEXTROSE 60000 UNIT/ML IV SOLN
3.0000 10*6.[IU] | INTRAVENOUS | Status: DC
  Administered 2021-05-25 – 2021-05-26 (×4): 3 10*6.[IU] via INTRAVENOUS
  Filled 2021-05-25 (×4): qty 50

## 2021-05-25 MED ORDER — FENTANYL CITRATE (PF) 100 MCG/2ML IJ SOLN: 50.0000 ug | INTRAMUSCULAR | Status: DC | PRN

## 2021-05-25 MED ORDER — SOD CITRATE-CITRIC ACID 500-334 MG/5ML PO SOLN
30.0000 mL | ORAL | Status: DC | PRN
  Administered 2021-05-26: 30 mL via ORAL
  Filled 2021-05-25: qty 30

## 2021-05-25 MED ORDER — TERBUTALINE SULFATE 1 MG/ML IJ SOLN
0.2500 mg | Freq: Once | INTRAMUSCULAR | Status: AC | PRN
  Administered 2021-05-26: 0.25 mg via SUBCUTANEOUS
  Filled 2021-05-25: qty 1

## 2021-05-25 MED ORDER — LACTATED RINGERS IV SOLN
INTRAVENOUS | Status: DC
  Administered 2021-05-25 (×2): 125 mL via INTRAVENOUS

## 2021-05-25 NOTE — Progress Notes (Signed)
Subjective:  Tracy Waller is a 30 y.o. [phone] at [redacted]w[redacted]d being seen today for ongoing prenatal care.  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  Patient reports  left-sided pain .  Contractions: Irregular. Vag. Bleeding: None.  Movement: Present. Denies leaking of fluid.   The following portions of the patient's history were reviewed and updated as appropriate: allergies, current medications, past family history, past medical history, past social history, past surgical history and problem list. Problem list updated.  Objective:   Vitals:   05/25/21 1051  BP: 125/79  Pulse: 75  Weight: 173 lb (78.5 kg)    Fetal Status:     Movement: Present     General:  Alert, oriented and cooperative. Patient is in no acute distress.  Skin: Skin is warm and dry. No rash noted.   Cardiovascular: Normal heart rate noted  Respiratory: Normal respiratory effort, no problems with respiration noted  Abdomen: Soft, gravid, appropriate for gestational age. Pain/Pressure: Present     Pelvic:  Cervical exam deferred        Extremities: Normal range of motion.     Mental Status: Normal mood and affect. Normal behavior. Normal judgment and thought content.   Urinalysis:      Assessment and Plan:  Pregnancy: [id number] at [redacted]w[redacted]d  1. Encounter for supervision of normal pregnancy in multigravida in first trimester Rx: - Fetal nonstress test:  Prolonged sleep pattern, then reactive with 1 mild variable.  FHR  135 bpm.  15x15 accels.  1 variable decel - discussed with Dr. Debroah Loop, the attending on call at [facility] and patient sent to [facility] for further evaluation and possible direct admission since she is on the schedule for IOL tomorrow   Term labor symptoms and general obstetric precautions including but not limited to vaginal bleeding, contractions, leaking of fluid and fetal movement were reviewed in detail with the patient. Please refer to After Visit Summary for other counseling recommendations.   Return in about 2 weeks (around 06/08/2021) for postpartum visit.   Brock Bad, MD  05/25/21

## 2021-05-25 NOTE — Progress Notes (Signed)
Labor Progress Note Tracy Waller is a 30 y.o. [phone] at [redacted]w[redacted]d presented for IOL for postdates, TOLAC.  S: Doing well. Resting comfortably in bed. No concerns at this time.   O:  BP 116/66   Pulse 77   Temp 98.3 F (36.8 C) (Axillary)   Resp 20   Ht 5\' 3"  (1.6 m)   Wt 79 kg   LMP 08/12/2020   SpO2 100%   BMI 30.86 kg/m   CVE: Dilation: 2 Effacement (%): 20 Station: -3 Presentation: Vertex Exam by:: Dr. [email or web address]   A&P: 30 y.o. 37 [redacted]w[redacted]d.   #Labor: SVE now 2/20/-3. FB placed without complication. Will continue Pitocin at current rate until FB dislodges. Consider AROM next check if favorable. #Pain: PRN; planning for epidural #FWB: Cat 1  #GBS positive > PCN  06-20-2004, MD 8:48 PM

## 2021-05-25 NOTE — Progress Notes (Signed)
Pt states she is having pain down left side. Pt is scheduled for IOL tomorrow.

## 2021-05-25 NOTE — H&P (Signed)
OBSTETRIC ADMISSION HISTORY AND PHYSICAL  Tracy Waller is a 30 y.o. female [phone] with IUP at 73w6dby LMP presenting for IOL for postdates. She reports +FMs, no LOF, no VB or peripheral edema. She reports having a headache at this time that started earlier this morning. She has been having an aching pain in her left lower back. She also saw some spots when standing up earlier. She currently feels better resting in bed. She rates her headache and back pain a 7/10. She plans on breast and bottle feeding. She requests Depo for birth control.  [facility]    Dating: By LMP --->  Estimated Date of Delivery: 05/19/21  Sono:   @[redacted]w[redacted]d , CWD, normal anatomy, cephalic presentation, posterior placental lie, 2201g, 25% EFW  Prenatal History/Complications:  Hx of CS due to arm presentation  HSV2 on Valtrex Alpha thalassemia trait   Past Medical History: Past Medical History:  Diagnosis Date   Hx of chlamydia infection 2017   Hx of trichomoniasis 2017   Syphilis     Past Surgical History: Past Surgical History:  Procedure Laterality Date   CESAREAN SECTION      Obstetrical History: OB History     Gravida  5   Para  3   Term  3   Preterm      AB  1   Living  3      SAB  1   IAB      Ectopic      Multiple      Live Births  3           Social History Social History   Socioeconomic History   Marital status: Single    Spouse name: Not on file   Number of children: Not on file   Years of education: Not on file   Highest education level: Not on file  Occupational History   Not on file  Tobacco Use   Smoking status: Former    Types: Cigarettes   Smokeless tobacco: Never   Tobacco comments:    last used about 2 weeks ago  Vaping Use   Vaping Use: Never used  Substance and Sexual Activity   Alcohol use: Not Currently    Comment: occasionally   Drug use: Yes    Types: Marijuana    Comment: daily   Sexual activity: Yes     Partners: Male    Birth control/protection: None  Other Topics Concern   Not on file  Social History Narrative   Not on file   Social Determinants of Health   Financial Resource Strain: Not on file  Food Insecurity: Not on file  Transportation Needs: Not on file  Physical Activity: Not on file  Stress: Not on file  Social Connections: Not on file    Family History: Family History  Problem Relation Age of Onset   Asthma Father    Hypertension Father    Hypertension Mother    Hypertension Sister     Allergies: No Known Allergies  Medications Prior to Admission  Medication Sig Dispense Refill Last Dose   Blood Pressure Monitoring (BLOOD PRESSURE KIT) DEVI 1 kit by Does not apply route once a week. 1 each 0    ondansetron (ZOFRAN ODT) 4 MG disintegrating tablet Take 1 tablet (4 mg total) by mouth every 8 (eight) hours as needed for nausea or vomiting. (Patient not taking: No sig reported) 15 tablet 0  Prenatal Vit-Fe Fumarate-FA (PREPLUS) 27-1 MG TABS Take 1 tablet by mouth daily. 30 tablet 13    terconazole (TERAZOL 3) 0.8 % vaginal cream Place 1 applicator vaginally at bedtime. (Patient not taking: Reported on 05/18/2021) 20 g 0    valACYclovir (VALTREX) 1000 MG tablet Take 1 tablet (1,000 mg total) by mouth daily. 30 tablet 11      Review of Systems  All systems reviewed and negative except as stated in HPI  Blood pressure 135/75, pulse 98, temperature 98.3 F (36.8 C), temperature source Oral, resp. rate 20, height 5' 3"  (1.6 m), weight 79 kg, last menstrual period 08/12/2020, SpO2 100 %.  General appearance: alert, cooperative, and no distress Lungs: normal WOB on room air  Heart: normal rate, warm and well perfused  Abdomen: soft, non-tender, gravid  Extremities: no LE edema or calf tenderness to palpation   Presentation: Cephalic by BSUS Fetal monitoring: Baseline 140 bpm, moderate variability, + accels, no decels Uterine activity: Irregular contractions   Dilation: Closed Effacement (%): Thick Station: -3 Exam by:: Vilma Meckel, MD  Prenatal labs: ABO, Rh: --/--/O POS (08/31 1352) Antibody: NEG (08/31 1352) Rubella: 1.59 (02/03 1609) RPR: Reactive (07/13 0943)  HBsAg: Negative (02/03 1609)  HIV: Non Reactive (07/13 0943)  GBS: Positive/-- (08/03 0442)  1 hr Glucola 116 Genetic screening  - Low risk with atypical finding on sex chromosome  Anatomy US normal   Prenatal Transfer Tool  Maternal Diabetes: No Genetic Screening: Normal with atypical finding on sex chromosome as noted above  Maternal Ultrasounds/Referrals: Normal Fetal Ultrasounds or other Referrals:  Referred to Materal Fetal Medicine  Maternal Substance Abuse:  No Significant Maternal Medications:  Valtrex 1000 mg daily  Significant Maternal Lab Results: Group B Strep positive  Results for orders placed or performed during the hospital encounter of 05/25/21 (from the past 24 hour(s))  CBC   Collection Time: 05/25/21  1:52 PM  Result Value Ref Range   WBC 10.4 4.0 - 10.5 K/uL   RBC 3.90 3.87 - 5.11 MIL/uL   Hemoglobin 10.7 (L) 12.0 - 15.0 g/dL   HCT 32.5 (L) 36.0 - 46.0 %   MCV 83.3 80.0 - 100.0 fL   MCH 27.4 26.0 - 34.0 pg   MCHC 32.9 30.0 - 36.0 g/dL   RDW 14.1 11.5 - 15.5 %   Platelets 214 150 - 400 K/uL   nRBC 0.0 0.0 - 0.2 %  Type and screen [place]   Collection Time: 05/25/21  1:52 PM  Result Value Ref Range   ABO/RH(D) O POS    Antibody Screen NEG    Sample Expiration      05/28/2021,2359 Performed at [facility], 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 [id number]  at 55w6dhere for IOL for postdates.   #Labor: SVE 0/thick/-3. Will start with low dose Pit and plan to place FB next check as able.  #Pain:  PRN; planning for epidural #FWB:  Cat 1  #ID:   GBS positive > PCN #MOF:  Breast/bottle  #MOC:  Depo #Circ:   Yes  #Hx of CS/TOLAC: Hx of CS in G2 pregnancy for arm presentation. Successful VBAC in G3 pregnancy. TOLAC consent signed 03/02/21. Reviewed again at bedside and patient wishes to proceed with TOLAC.   #Headache: Patient reports headache 7/10 upon arrival. Initial BP normal. One mild range pressure with systolic in the 1784R Now back to normal range. CMP and UPC ordered. Will follow pressures and treat headache with Tylenol. Will follow up labs.  CGenia Del MD  OB Fellow  Faculty Practice 05/25/2021, 4:17 PM

## 2021-05-26 ENCOUNTER — Inpatient Hospital Stay (HOSPITAL_COMMUNITY): Payer: Medicaid Other

## 2021-05-26 ENCOUNTER — Encounter (HOSPITAL_COMMUNITY)
Admission: AD | Disposition: A | Discharge status: Home / Self Care | Payer: Self-pay | Source: Home / Self Care | Attending: Obstetrics & Gynecology

## 2021-05-26 ENCOUNTER — Inpatient Hospital Stay (HOSPITAL_COMMUNITY): Payer: Medicaid Other | Admitting: Anesthesiology

## 2021-05-26 ENCOUNTER — Encounter (HOSPITAL_COMMUNITY): Payer: Self-pay | Admitting: Family Medicine

## 2021-05-26 DIAGNOSIS — Z3A41 41 weeks gestation of pregnancy: Secondary | ICD-10-CM | POA: Diagnosis not present

## 2021-05-26 DIAGNOSIS — Z9889 Other specified postprocedural states: Secondary | ICD-10-CM | POA: Diagnosis not present

## 2021-05-26 DIAGNOSIS — O99824 Streptococcus B carrier state complicating childbirth: Secondary | ICD-10-CM | POA: Diagnosis not present

## 2021-05-26 DIAGNOSIS — O9832 Other infections with a predominantly sexual mode of transmission complicating childbirth: Secondary | ICD-10-CM | POA: Diagnosis not present

## 2021-05-26 DIAGNOSIS — O34211 Maternal care for low transverse scar from previous cesarean delivery: Secondary | ICD-10-CM | POA: Diagnosis not present

## 2021-05-26 DIAGNOSIS — A6000 Herpesviral infection of urogenital system, unspecified: Secondary | ICD-10-CM | POA: Diagnosis not present

## 2021-05-26 DIAGNOSIS — Z3A00 Weeks of gestation of pregnancy not specified: Secondary | ICD-10-CM | POA: Diagnosis not present

## 2021-05-26 DIAGNOSIS — Q5039 Other congenital malformation of ovary: Secondary | ICD-10-CM

## 2021-05-26 DIAGNOSIS — O34599 Maternal care for other abnormalities of gravid uterus, unspecified trimester: Secondary | ICD-10-CM | POA: Diagnosis not present

## 2021-05-26 DIAGNOSIS — O9081 Anemia of the puerperium: Secondary | ICD-10-CM | POA: Diagnosis not present

## 2021-05-26 DIAGNOSIS — D563 Thalassemia minor: Secondary | ICD-10-CM | POA: Diagnosis not present

## 2021-05-26 DIAGNOSIS — Z20822 Contact with and (suspected) exposure to covid-19: Secondary | ICD-10-CM | POA: Diagnosis not present

## 2021-05-26 DIAGNOSIS — D62 Acute posthemorrhagic anemia: Secondary | ICD-10-CM | POA: Diagnosis not present

## 2021-05-26 DIAGNOSIS — Z87891 Personal history of nicotine dependence: Secondary | ICD-10-CM | POA: Diagnosis not present

## 2021-05-26 DIAGNOSIS — O480 Post-term pregnancy: Secondary | ICD-10-CM | POA: Diagnosis not present

## 2021-05-26 DIAGNOSIS — Z98891 History of uterine scar from previous surgery: Secondary | ICD-10-CM

## 2021-05-26 LAB — RPR
RPR Ser Ql: REACTIVE — AB
RPR Titer: 1:1 {titer}

## 2021-05-26 SURGERY — Surgical Case
Anesthesia: Epidural | Wound class: Clean Contaminated

## 2021-05-26 MED ORDER — COCONUT OIL OIL: 1.0000 "application " | TOPICAL_OIL | Status: DC | PRN

## 2021-05-26 MED ORDER — NALBUPHINE HCL 10 MG/ML IJ SOLN: 5.0000 mg | INTRAMUSCULAR | Status: DC | PRN

## 2021-05-26 MED ORDER — LACTATED RINGERS IV SOLN
500.0000 mL | Freq: Once | INTRAVENOUS | Status: AC
  Administered 2021-05-26: 500 mL via INTRAVENOUS

## 2021-05-26 MED ORDER — TETANUS-DIPHTH-ACELL PERTUSSIS 5-2.5-18.5 LF-MCG/0.5 IM SUSY: 0.5000 mL | PREFILLED_SYRINGE | Freq: Once | INTRAMUSCULAR | Status: DC

## 2021-05-26 MED ORDER — SCOPOLAMINE 1 MG/3DAYS TD PT72: 1.0000 | MEDICATED_PATCH | Freq: Once | TRANSDERMAL | Status: DC

## 2021-05-26 MED ORDER — ACETAMINOPHEN 500 MG PO TABS
1000.0000 mg | ORAL_TABLET | Freq: Four times a day (QID) | ORAL | Status: AC
  Administered 2021-05-26 – 2021-05-27 (×4): 1000 mg via ORAL
  Filled 2021-05-26 (×4): qty 2

## 2021-05-26 MED ORDER — SIMETHICONE 80 MG PO CHEW
80.0000 mg | CHEWABLE_TABLET | Freq: Three times a day (TID) | ORAL | Status: DC
  Administered 2021-05-26 – 2021-05-28 (×4): 80 mg via ORAL
  Filled 2021-05-26 (×5): qty 1

## 2021-05-26 MED ORDER — MENTHOL 3 MG MT LOZG: 1.0000 | LOZENGE | OROMUCOSAL | Status: DC | PRN

## 2021-05-26 MED ORDER — PHENYLEPHRINE 40 MCG/ML (10ML) SYRINGE FOR IV PUSH (FOR BLOOD PRESSURE SUPPORT): 80.0000 ug | PREFILLED_SYRINGE | INTRAVENOUS | Status: DC | PRN

## 2021-05-26 MED ORDER — OXYTOCIN-SODIUM CHLORIDE 30-0.9 UT/500ML-% IV SOLN
INTRAVENOUS | Status: AC
  Filled 2021-05-26: qty 500

## 2021-05-26 MED ORDER — PHENYLEPHRINE HCL-NACL 20-0.9 MG/250ML-% IV SOLN
INTRAVENOUS | Status: DC | PRN
  Administered 2021-05-26: 60 ug/min via INTRAVENOUS

## 2021-05-26 MED ORDER — LACTATED RINGERS IV SOLN: INTRAVENOUS | Status: DC

## 2021-05-26 MED ORDER — PROMETHAZINE HCL 25 MG/ML IJ SOLN: 6.2500 mg | INTRAMUSCULAR | Status: DC | PRN

## 2021-05-26 MED ORDER — MEPERIDINE HCL 25 MG/ML IJ SOLN
INTRAMUSCULAR | Status: DC | PRN
  Administered 2021-05-26: 12.5 mg via INTRAVENOUS

## 2021-05-26 MED ORDER — NALBUPHINE HCL 10 MG/ML IJ SOLN: 5.0000 mg | Freq: Once | INTRAMUSCULAR | Status: DC | PRN

## 2021-05-26 MED ORDER — PRENATAL MULTIVITAMIN CH
1.0000 | ORAL_TABLET | Freq: Every day | ORAL | Status: DC
  Administered 2021-05-27 – 2021-05-28 (×2): 1 via ORAL
  Filled 2021-05-26 (×2): qty 1

## 2021-05-26 MED ORDER — MEPERIDINE HCL 25 MG/ML IJ SOLN: 6.2500 mg | INTRAMUSCULAR | Status: DC | PRN

## 2021-05-26 MED ORDER — KETOROLAC TROMETHAMINE 30 MG/ML IJ SOLN
INTRAMUSCULAR | Status: AC
  Filled 2021-05-26: qty 1

## 2021-05-26 MED ORDER — LIDOCAINE HCL (PF) 1 % IJ SOLN
INTRAMUSCULAR | Status: DC | PRN
  Administered 2021-05-26: 4 mL via EPIDURAL
  Administered 2021-05-26: 5 mL via EPIDURAL

## 2021-05-26 MED ORDER — SODIUM CHLORIDE 0.9% FLUSH: 3.0000 mL | INTRAVENOUS | Status: DC | PRN

## 2021-05-26 MED ORDER — ACETAMINOPHEN 10 MG/ML IV SOLN
INTRAVENOUS | Status: DC | PRN
  Administered 2021-05-26: 1000 mg via INTRAVENOUS

## 2021-05-26 MED ORDER — ONDANSETRON HCL 4 MG/2ML IJ SOLN
INTRAMUSCULAR | Status: DC | PRN
  Administered 2021-05-26: 4 mg via INTRAVENOUS

## 2021-05-26 MED ORDER — ONDANSETRON HCL 4 MG/2ML IJ SOLN
INTRAMUSCULAR | Status: AC
  Filled 2021-05-26: qty 4

## 2021-05-26 MED ORDER — SIMETHICONE 80 MG PO CHEW: 80.0000 mg | CHEWABLE_TABLET | ORAL | Status: DC | PRN

## 2021-05-26 MED ORDER — STERILE WATER FOR IRRIGATION IR SOLN
Status: DC | PRN
  Administered 2021-05-26: 1

## 2021-05-26 MED ORDER — DIPHENHYDRAMINE HCL 25 MG PO CAPS
25.0000 mg | ORAL_CAPSULE | Freq: Four times a day (QID) | ORAL | Status: DC | PRN
  Administered 2021-05-26: 25 mg via ORAL
  Filled 2021-05-26: qty 1

## 2021-05-26 MED ORDER — MEASLES, MUMPS & RUBELLA VAC IJ SOLR: 0.5000 mL | Freq: Once | INTRAMUSCULAR | Status: DC

## 2021-05-26 MED ORDER — WITCH HAZEL-GLYCERIN EX PADS: 1.0000 "application " | MEDICATED_PAD | CUTANEOUS | Status: DC | PRN

## 2021-05-26 MED ORDER — METHYLERGONOVINE MALEATE 0.2 MG/ML IJ SOLN
INTRAMUSCULAR | Status: DC | PRN
  Administered 2021-05-26: .2 mg via INTRAMUSCULAR

## 2021-05-26 MED ORDER — DIBUCAINE (PERIANAL) 1 % EX OINT: 1.0000 "application " | TOPICAL_OINTMENT | CUTANEOUS | Status: DC | PRN

## 2021-05-26 MED ORDER — LIDOCAINE-EPINEPHRINE (PF) 2 %-1:200000 IJ SOLN
INTRAMUSCULAR | Status: DC | PRN
Start: 2021-05-26 — End: 2021-05-26
  Administered 2021-05-26: 5 mL via EPIDURAL
  Administered 2021-05-26: 10 mL via EPIDURAL

## 2021-05-26 MED ORDER — PHENYLEPHRINE 40 MCG/ML (10ML) SYRINGE FOR IV PUSH (FOR BLOOD PRESSURE SUPPORT)
PREFILLED_SYRINGE | INTRAVENOUS | Status: DC | PRN
  Administered 2021-05-26 (×3): 80 ug via INTRAVENOUS

## 2021-05-26 MED ORDER — TRANEXAMIC ACID-NACL 1000-0.7 MG/100ML-% IV SOLN
INTRAVENOUS | Status: DC | PRN
  Administered 2021-05-26: 1000 mg via INTRAVENOUS

## 2021-05-26 MED ORDER — EPHEDRINE 5 MG/ML INJ: 10.0000 mg | INTRAVENOUS | Status: DC | PRN

## 2021-05-26 MED ORDER — KETOROLAC TROMETHAMINE 30 MG/ML IJ SOLN
30.0000 mg | Freq: Four times a day (QID) | INTRAMUSCULAR | Status: DC | PRN
  Administered 2021-05-26: 30 mg via INTRAVENOUS

## 2021-05-26 MED ORDER — LACTATED RINGERS AMNIOINFUSION: INTRAVENOUS | Status: DC

## 2021-05-26 MED ORDER — DIPHENHYDRAMINE HCL 50 MG/ML IJ SOLN: 12.5000 mg | INTRAMUSCULAR | Status: DC | PRN

## 2021-05-26 MED ORDER — ENOXAPARIN SODIUM 40 MG/0.4ML IJ SOSY
40.0000 mg | PREFILLED_SYRINGE | INTRAMUSCULAR | Status: DC
  Administered 2021-05-27 – 2021-05-28 (×2): 40 mg via SUBCUTANEOUS
  Filled 2021-05-26 (×2): qty 0.4

## 2021-05-26 MED ORDER — NALOXONE HCL 0.4 MG/ML IJ SOLN: 0.4000 mg | INTRAMUSCULAR | Status: DC | PRN

## 2021-05-26 MED ORDER — CEFAZOLIN SODIUM-DEXTROSE 2-3 GM-%(50ML) IV SOLR
INTRAVENOUS | Status: DC | PRN
  Administered 2021-05-26: 2 g via INTRAVENOUS

## 2021-05-26 MED ORDER — DEXAMETHASONE SODIUM PHOSPHATE 4 MG/ML IJ SOLN
INTRAMUSCULAR | Status: AC
  Filled 2021-05-26: qty 1

## 2021-05-26 MED ORDER — KETOROLAC TROMETHAMINE 30 MG/ML IJ SOLN
30.0000 mg | Freq: Four times a day (QID) | INTRAMUSCULAR | Status: AC
Start: 2021-05-26 — End: 2021-05-27
  Administered 2021-05-26 – 2021-05-27 (×4): 30 mg via INTRAVENOUS
  Filled 2021-05-26 (×4): qty 1

## 2021-05-26 MED ORDER — OXYTOCIN-SODIUM CHLORIDE 30-0.9 UT/500ML-% IV SOLN
INTRAVENOUS | Status: DC | PRN
  Administered 2021-05-26 (×2): 30 [IU] via INTRAVENOUS

## 2021-05-26 MED ORDER — MEPERIDINE HCL 25 MG/ML IJ SOLN
INTRAMUSCULAR | Status: AC
  Filled 2021-05-26: qty 1

## 2021-05-26 MED ORDER — MORPHINE SULFATE (PF) 0.5 MG/ML IJ SOLN
INTRAMUSCULAR | Status: AC
  Filled 2021-05-26: qty 10

## 2021-05-26 MED ORDER — HYDROMORPHONE HCL 1 MG/ML IJ SOLN: 0.2500 mg | INTRAMUSCULAR | Status: DC | PRN

## 2021-05-26 MED ORDER — DEXAMETHASONE SODIUM PHOSPHATE 4 MG/ML IJ SOLN
INTRAMUSCULAR | Status: DC | PRN
  Administered 2021-05-26: 4 mg via INTRAVENOUS

## 2021-05-26 MED ORDER — MORPHINE SULFATE (PF) 0.5 MG/ML IJ SOLN
INTRAMUSCULAR | Status: DC | PRN
  Administered 2021-05-26: 3 mg via EPIDURAL

## 2021-05-26 MED ORDER — SODIUM CHLORIDE 0.9 % IR SOLN
Status: DC | PRN
  Administered 2021-05-26: 1

## 2021-05-26 MED ORDER — FAMOTIDINE IN NACL 20-0.9 MG/50ML-% IV SOLN: 20.0000 mg | Freq: Once | INTRAVENOUS | Status: DC

## 2021-05-26 MED ORDER — LIDOCAINE HCL (PF) 2 % IJ SOLN
INTRAMUSCULAR | Status: AC
  Filled 2021-05-26: qty 10

## 2021-05-26 MED ORDER — MEDROXYPROGESTERONE ACETATE 150 MG/ML IM SUSP: 150.0000 mg | INTRAMUSCULAR | Status: DC | PRN

## 2021-05-26 MED ORDER — DIPHENHYDRAMINE HCL 25 MG PO CAPS: 25.0000 mg | ORAL_CAPSULE | ORAL | Status: DC | PRN

## 2021-05-26 MED ORDER — OXYCODONE HCL 5 MG PO TABS
5.0000 mg | ORAL_TABLET | ORAL | Status: DC | PRN
  Administered 2021-05-27 – 2021-05-28 (×2): 5 mg via ORAL
  Filled 2021-05-26 (×2): qty 1

## 2021-05-26 MED ORDER — SENNOSIDES-DOCUSATE SODIUM 8.6-50 MG PO TABS
2.0000 | ORAL_TABLET | Freq: Every day | ORAL | Status: DC
  Administered 2021-05-27 – 2021-05-28 (×2): 2 via ORAL
  Filled 2021-05-26 (×2): qty 2

## 2021-05-26 MED ORDER — KETOROLAC TROMETHAMINE 30 MG/ML IJ SOLN: 30.0000 mg | Freq: Once | INTRAMUSCULAR | Status: DC | PRN

## 2021-05-26 MED ORDER — FENTANYL-BUPIVACAINE-NACL 0.5-0.125-0.9 MG/250ML-% EP SOLN
12.0000 mL/h | EPIDURAL | Status: DC | PRN
  Administered 2021-05-26: 12 mL/h via EPIDURAL
  Filled 2021-05-26: qty 250

## 2021-05-26 MED ORDER — OXYTOCIN-SODIUM CHLORIDE 30-0.9 UT/500ML-% IV SOLN: 2.5000 [IU]/h | INTRAVENOUS | Status: AC

## 2021-05-26 MED ORDER — KETOROLAC TROMETHAMINE 30 MG/ML IJ SOLN: 30.0000 mg | Freq: Four times a day (QID) | INTRAMUSCULAR | Status: DC | PRN

## 2021-05-26 MED ORDER — IBUPROFEN 600 MG PO TABS
600.0000 mg | ORAL_TABLET | Freq: Four times a day (QID) | ORAL | Status: DC
  Administered 2021-05-27 – 2021-05-28 (×4): 600 mg via ORAL
  Filled 2021-05-26 (×4): qty 1

## 2021-05-26 MED ORDER — NALOXONE HCL 4 MG/10ML IJ SOLN
1.0000 ug/kg/h | INTRAVENOUS | Status: DC | PRN
  Filled 2021-05-26: qty 5

## 2021-05-26 MED ORDER — ONDANSETRON HCL 4 MG/2ML IJ SOLN: 4.0000 mg | Freq: Three times a day (TID) | INTRAMUSCULAR | Status: DC | PRN

## 2021-05-26 SURGICAL SUPPLY — 33 items
BENZOIN TINCTURE PRP APPL 2/3 (GAUZE/BANDAGES/DRESSINGS) ×2 IMPLANT
CHLORAPREP W/TINT 26ML (MISCELLANEOUS) ×2 IMPLANT
CLIP FILSHIE TUBAL LIGA STRL (Clip) IMPLANT
CLOSURE STERI-STRIP 1/4X4 (GAUZE/BANDAGES/DRESSINGS) ×2 IMPLANT
CLOTH BEACON ORANGE TIMEOUT ST (SAFETY) ×2 IMPLANT
DRSG OPSITE POSTOP 4X10 (GAUZE/BANDAGES/DRESSINGS) ×2 IMPLANT
ELECT REM PT RETURN 9FT ADLT (ELECTROSURGICAL) ×2
ELECTRODE REM PT RTRN 9FT ADLT (ELECTROSURGICAL) ×1 IMPLANT
GLOVE BIOGEL PI IND STRL 7.0 (GLOVE) ×3 IMPLANT
GLOVE BIOGEL PI INDICATOR 7.0 (GLOVE) ×3
GLOVE ECLIPSE 6.5 STRL STRAW (GLOVE) ×2 IMPLANT
GOWN STRL REUS W/ TWL LRG LVL3 (GOWN DISPOSABLE) ×2 IMPLANT
GOWN STRL REUS W/TWL LRG LVL3 (GOWN DISPOSABLE) ×4
HEMOSTAT ARISTA ABSORB 3G PWDR (HEMOSTASIS) ×2 IMPLANT
NS IRRIG 1000ML POUR BTL (IV SOLUTION) ×2 IMPLANT
PAD OB MATERNITY 4.3X12.25 (PERSONAL CARE ITEMS) ×2 IMPLANT
PAD PREP 24X48 CUFFED NSTRL (MISCELLANEOUS) ×2 IMPLANT
RETRACTOR WND ALEXIS 25 LRG (MISCELLANEOUS) IMPLANT
RTRCTR WOUND ALEXIS 25CM LRG (MISCELLANEOUS)
STRIP CLOSURE SKIN 1/2X4 (GAUZE/BANDAGES/DRESSINGS) ×2 IMPLANT
SUT MON AB-0 CT1 36 (SUTURE) ×6 IMPLANT
SUT PLAIN 2 0 (SUTURE) ×2
SUT PLAIN 2 0 XLH (SUTURE) ×2 IMPLANT
SUT PLAIN ABS 2-0 CT1 27XMFL (SUTURE) ×1 IMPLANT
SUT VIC AB 0 CT1 27 (SUTURE) ×2
SUT VIC AB 0 CT1 27XBRD ANBCTR (SUTURE) ×1 IMPLANT
SUT VIC AB 0 CT1 36 (SUTURE) ×4 IMPLANT
SUT VIC AB 2-0 CT1 27 (SUTURE) ×2
SUT VIC AB 2-0 CT1 TAPERPNT 27 (SUTURE) ×1 IMPLANT
SUT VIC AB 4-0 KS 27 (SUTURE) ×4 IMPLANT
TOWEL OR 17X24 6PK STRL BLUE (TOWEL DISPOSABLE) ×6 IMPLANT
TRAY FOLEY CATH SILVER 16FR (SET/KITS/TRAYS/PACK) ×2 IMPLANT
WATER STERILE IRR 1000ML POUR (IV SOLUTION) ×2 IMPLANT

## 2021-05-26 NOTE — Progress Notes (Signed)
Tracy Waller is a 30 y.o. [id number] at [redacted]w[redacted]d admitted for TOLAC/induction of labor due to postdates.  Subjective: Feeling contractions get stronger  Objective: BP (!) 145/88   Pulse 70   Temp 98.3 F (36.8 C) (Axillary)   Resp 20   Ht 5\' 3"  (1.6 m)   Wt 79 kg   LMP 08/12/2020   SpO2 100%   BMI 30.86 kg/m  No intake/output data recorded. No intake/output data recorded.  FHT:  FHR: 140 bpm, variability: moderate,  accelerations:  Present,  decelerations:  Absent UC:   regular, every 2 minutes SVE:   Dilation: 5 Effacement (%): 50 Station: -2 Exam by:: Dr. [email or web address]  Labs: Lab Results  Component Value Date   WBC 10.4 05/25/2021   HGB 10.7 (L) 05/25/2021   HCT 32.5 (L) 05/25/2021   MCV 83.3 05/25/2021   PLT 214 05/25/2021    Assessment / Plan: Induction of labor due to post dates,  progressing well on pitocin  Labor: Progressing on Pitocin, head still high and not well applied,  will continue to increase then AROM Fetal Wellbeing:  Category I Pain Control:  Epidural I/D:   GBS positive- PCN Anticipated MOD:  NSVD  05/27/2021 05/26/2021, 1:24 AM

## 2021-05-26 NOTE — Op Note (Signed)
Operative Note   Patient: Tracy Waller  Date of Procedure: 05/26/2021  Procedure: Primary Low Transverse Cesarean   Indications: non-reassuring fetal status, history of prior C-section  Pre-operative Diagnosis: non reassuring fetal status history of cesarean section.   Post-operative Diagnosis: Same. Uterine Window, lower uterine segment vertical rupture, abnormal adherence of ovary to lower uterine segment (suspected Right Ovary)  TOLAC Candidate: No- will need scheduled C-section at 37 weeks.   Surgeon: Surgeon(s) and Role:    * Federico Flake, MD - Primary    * Worthy Rancher, MD - Assisting  Assistants: none  Anesthesia: epidural  Anesthesiologist: Leilani Able, MD   Antibiotics: Cefazolin and Azithromycin   Estimated Blood Loss: 350 ml   Total IV Fluids: 1500 ml  Urine Output:  150 cc OF clear urine  Specimens: placenta    Complications: no complications   Indications: Tracy Waller is a 30 y.o. [id number] with an IUP [redacted]w[redacted]d presenting for unscheduled, emergent cesarean secondary to the indications listed above. Clinical course notable for induction of labor for postdates with a history of prior CS. Labor was marked by arrest of 9 cm and then fetus started having bradycardic episodes to the 70s.  Findings: Viable infant in cephalic presentation, no nuchal cord present. Apgars 8 , 9 , . Weight 3330 g . Bloody amniotic fluid. Normal placenta, three vessel cord. Normal uterus, Normal bilateral fallopian tubes, Normal bilateral ovaries- though there was an ovary adhered to the right lower uterine segment. Bloody peritoneal fluid.  Uterine window noted along on the left side of prior hysterotomy, uterine rupture vertically midline to 1cm from cervix.   Procedure Details:  A Time Out was held and the above information confirmed. The patient received intravenous antibiotics and had sequential compression devices applied to her lower extremities  preoperatively. The patient was taken back to the operative suite where epidural anesthesia was administered. After assuring adequate dosing of epidural anesthesia, the patient was draped and prepped in the usual sterile manner and placed in a dorsal supine position with a leftward tilt. A low transverse skin incision was made with scalpel and carried down through the subcutaneous tissue to the fascia. Fascial incision was made and extended transversely. The fascia was separated from the underlying rectus tissue superiorly and inferiorly. The rectus muscles were separated in the midline bluntly and the peritoneum was entered bluntly. A Rich retractor and bladder blade were placed to aid in visualization of the uterus. A bladder flap was not developed. A low transverse uterine incision was made through prior hysterotomy and extended laterally. The infant was successfully delivered from cephalic presentation, the umbilical cord was clamped immediately. Cord ph was sent, and cord blood was obtained for evaluation. The placenta was removed Intact and appeared normal. I identified uterine edges carefully using Alice clamps. The vertical uterine rupture extended down to about 1cm from the cervix. I used locking sutures with 0-Monocryl to repair the defect. The transverse uterine incision was closed with running locked sutures of 0-Monocryl. I reinforced the intersection of the uterine rupture defect and the hysterotomy. A second imbricating layer was also placed with 0-Monocryl.   The abdominal cavity was well irrigated with normal saline. Overall, excellent hemostasis was noted. Peritoneum was closed using 0 vicryl. Fascia was closed in the typical fashion with 0-Monocryl. We used plain gut to close the subcutaneous space. Skin closure was performed in the typical fashion.   Events per RN  Code Cesarean called about ~1200 Entered room  at 12:02 PM Skin incision 12:07 PM Delivery of infant: 12:08 PM  Disposition:  PACU - hemodynamically stable.  XR will be performed due to not counting laps due to emergent nature of case.   Signed: Federico Flake, MD, MPH [facility])

## 2021-05-26 NOTE — Progress Notes (Signed)
Called by Monna Fam due to fetal bradycardia and patient complaining of chest pain.   Tracy Waller is a [id number] at [redacted]w[redacted]d here for IOL for Postdates. History of prior LTCS x 1 with successful VBAC.    I arrived at the room and FHR was in the 70s and had been for several minutes. Patient also notably had be 9 cm for > 2 hrs.   Code Cesearean called  The risks of cesarean section were discussed with the patient including but were not limited to: bleeding which may require transfusion or reoperation; infection which may require antibiotics; injury to bowel, bladder, ureters or other surrounding organs; injury to the fetus; need for additional procedures including hysterectomy in the event of a life-threatening hemorrhage; placental abnormalities wth subsequent pregnancies, incisional problems, thromboembolic phenomenon and other postoperative/anesthesia complications.   The patient concurred with the proposed plan verbally.  Patient has been NPO since last night she will remain NPO for procedure. Anesthesia and OR aware.  Preoperative prophylactic antibiotics and SCDs ordered on call to the OR.  To OR when ready.  Federico Flake, MD, MPH, ABFM, [facility] Attending Physician [facility]

## 2021-05-26 NOTE — Anesthesia Procedure Notes (Signed)
Epidural Patient location during procedure: OB Start time: 05/26/2021 1:26 AM End time: 05/26/2021 1:36 AM  Staffing Anesthesiologist: Beryle Lathe, MD Performed: anesthesiologist   Preanesthetic Checklist Completed: patient identified, IV checked, risks and benefits discussed, monitors and equipment checked, pre-op evaluation and timeout performed  Epidural Patient position: sitting Prep: DuraPrep Patient monitoring: continuous pulse ox and blood pressure Approach: right paramedian (1st 2 approaches midline, unable to get past bone due to poor positioning) Location: L3-L4 Injection technique: LOR saline  Needle:  Needle type: Tuohy  Needle gauge: 17 G Needle length: 9 cm Needle insertion depth: 6 cm Catheter size: 19 Gauge Catheter at skin depth: 11 cm Test dose: negative and Other (1% lidocaine)  Assessment Events: blood not aspirated  Additional Notes Patient identified. Risks including, but not limited to, bleeding, infection, nerve damage, paralysis, inadequate analgesia, blood pressure changes, nausea, vomiting, allergic reaction, postpartum back pain, itching, and headache were discussed. Patient expressed understanding and wished to proceed. Sterile prep and drape, including hand hygiene, mask, and sterile gloves were used. The patient was positioned and the spine was prepped. The skin was anesthetized with lidocaine. No paraesthesia or other complication noted. The patient did not experience any signs of intravascular injection such as tinnitus or metallic taste in mouth, nor signs of intrathecal spread such as rapid motor block. Please see nursing notes for vital signs. The patient tolerated the procedure well.   Leslye Peer, MDReason for block:procedure for pain

## 2021-05-26 NOTE — Progress Notes (Signed)
LABOR PROGRESS NOTE  Tracy Waller is a 30 y.o. [id number] at [redacted]w[redacted]d  admitted for TOLAC/induction of labor due to post dates.   Subjective: Resting comfortably .   Objective: BP 118/77   Pulse 85   Temp 98.1 F (36.7 C) (Oral)   Resp 20   Ht 5\' 3"  (1.6 m)   Wt 79 kg   LMP 08/12/2020   SpO2 100%   BMI 30.86 kg/m  or  Vitals:   05/26/21 0941 05/26/21 0946 05/26/21 1006 05/26/21 1008  BP:    118/77  Pulse:    85  Resp:      Temp:      TempSrc:      SpO2: 100% 99% 100%   Weight:      Height:        Dilation: 9 Effacement (%): 80 Station: -1 Presentation: Vertex Exam by:: Crescenzo FHT: baseline rate 125, moderate varibility, + acel, no decel Toco: 2-3 min  Labs: Lab Results  Component Value Date   WBC 10.4 05/25/2021   HGB 10.7 (L) 05/25/2021   HCT 32.5 (L) 05/25/2021   MCV 83.3 05/25/2021   PLT 214 05/25/2021    Patient Active Problem List   Diagnosis Date Noted   Indication for care in labor or delivery 05/25/2021   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 37 at [redacted]w[redacted]d here for IOL for post dates   Labor: Progressing well, ruptured forebag on this check. S/p AROM, currently on pitocin @7 . IUPC placed and will titrate pitocin up 2x2 Fetal Wellbeing:  cat 1, reassuring  Pain Control:  Epidural  Anticipated MOD:  Vaginal  I/D: GBS Positive- PCN   [redacted]w[redacted]d, MD  PGY-3, [facility]  05/26/2021, 11:34 AM

## 2021-05-26 NOTE — Transfer of Care (Signed)
Immediate Anesthesia Transfer of Care Note  Patient: Tracy Waller  Procedure(s) Performed: CESAREAN SECTION  Patient Location: PACU  Anesthesia Type:Epidural  Level of Consciousness: awake, alert  and oriented  Airway & Oxygen Therapy: Patient Spontanous Breathing  Post-op Assessment: Report given to RN and Post -op Vital signs reviewed and stable  Post vital signs: Reviewed and stable  Last Vitals:  Vitals Value Taken Time  BP 132/92 05/26/21 1415  Temp    Pulse 101 05/26/21 1422  Resp 16 05/26/21 1422  SpO2 100 % 05/26/21 1422  Vitals shown include unvalidated device data.  Last Pain:  Vitals:   05/26/21 0442  TempSrc: Oral  PainSc:          Complications: No notable events documented.

## 2021-05-26 NOTE — Anesthesia Postprocedure Evaluation (Signed)
Anesthesia Post Note  Patient: Tracy Waller  Procedure(s) Performed: CESAREAN SECTION     Patient location during evaluation: PACU Anesthesia Type: Epidural Level of consciousness: awake Pain management: pain level controlled Vital Signs Assessment: post-procedure vital signs reviewed and stable Respiratory status: spontaneous breathing Cardiovascular status: stable Postop Assessment: no headache, no backache, epidural receding, patient able to bend at knees and no apparent nausea or vomiting Anesthetic complications: no   No notable events documented.  Last Vitals:  Vitals:   05/26/21 1410 05/26/21 1430  BP: 132/84 131/86  Pulse: (!) 108 (!) 101  Resp: 18 18  Temp: 36.8 C   SpO2: 100% 100%    Last Pain:  Vitals:   05/26/21 1440  TempSrc:   PainSc: 0-No pain   Pain Goal:                Epidural/Spinal Function Cutaneous sensation: Vague (05/26/21 1440), Patient able to flex knees: Yes (05/26/21 1440), Patient able to lift hips off bed: Yes (05/26/21 1440), Back pain beyond tenderness at insertion site: No (05/26/21 1440), Progressively worsening motor and/or sensory loss: No (05/26/21 1440), Bowel and/or bladder incontinence post epidural: No (05/26/21 1440)  Caren Macadam

## 2021-05-26 NOTE — Progress Notes (Signed)
LABOR PROGRESS NOTE  Tracy Waller is a 30 y.o. [id number] at [redacted]w[redacted]d  admitted for TOLAC/induction of labor due to post dates.   Subjective: Doing well, feeling contractions.   Objective: BP 128/70   Pulse 80   Temp 98.1 F (36.7 C) (Oral)   Resp 20   Ht 5\' 3"  (1.6 m)   Wt 79 kg   LMP 08/12/2020   SpO2 100%   BMI 30.86 kg/m  or  Vitals:   05/26/21 0802 05/26/21 0832 05/26/21 0931 05/26/21 0936  BP: 125/74 128/70    Pulse: (!) 107 80    Resp:      Temp:      TempSrc:      SpO2:   100% 100%  Weight:      Height:        Dilation: 9 Effacement (%): 80 Station: -1 Presentation: Vertex Exam by:: Breaker Springer, MD FHT: baseline rate 135, moderate varibility, noacel, no decel Toco: 2-3 min  Labs: Lab Results  Component Value Date   WBC 10.4 05/25/2021   HGB 10.7 (L) 05/25/2021   HCT 32.5 (L) 05/25/2021   MCV 83.3 05/25/2021   PLT 214 05/25/2021    Patient Active Problem List   Diagnosis Date Noted   Indication for care in labor or delivery 05/25/2021   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 37 at [redacted]w[redacted]d here for IOL for post dates   Labor: Progressing well, ruptured forebag on this check.  Fetal Wellbeing:  cat 1, reassuring  Pain Control:  Epidural  Anticipated MOD:  Vaginal  I/D: GBS Positive- PCN   [redacted]w[redacted]d, MD  PGY-3, [facility]  05/26/2021, 9:41 AM

## 2021-05-26 NOTE — Discharge Summary (Signed)
Postpartum Discharge Summary  Date of Service 05/28/2021     Patient Name: Tracy Waller DOB: 1991/09/07 MRN: [id number]  Date of admission: 05/25/2021 Delivery date:05/26/2021  Delivering provider: Caren Macadam  Date of discharge: 05/28/2021  Admitting diagnosis: Indication for care in labor or delivery [O75.9] Intrauterine pregnancy: [redacted]w[redacted]d    Secondary diagnosis:  Active Problems:   History of pregnancy induced hypertension   History of syphilis   Trichomonas infection   Problems related to high-risk sexual behavior   Sex chromosome abnormalities   S/P repeat low transverse C-section   Right Ovary adherred to lower uterine segement   Anemia, postpartum  Additional problems: NA    Discharge diagnosis: Term Pregnancy Delivered                                              Post partum procedures: NA Augmentation: AROM, Pitocin, and IP Foley Complications: Uterine Rupture  Hospital course: Induction of Labor With Cesarean Section   30y.o. yo [id number] admitted to the hospital 05/25/2021 for induction of labor. Patient was started on Pitocin . The patient went for cesarean section due to Non-Reassuring FHR. Delivery details are as follows: Membrane Rupture Time/Date: 5:12 AM ,05/26/2021   Delivery Method:C-Section, Low Transverse  Details of operation can be found in separate operative Note.  Patient had an uncomplicated postpartum course. She is ambulating, tolerating a regular diet, passing flatus, and urinating well.  Patient is discharged home in stable condition on 05/28/21.      Newborn Data: Birth date:05/26/2021  Birth time:12:08 PM  Gender:Female  Living status:Living  Apgars:8 ,9  Weight:3330 g                                Magnesium Sulfate received: No BMZ received: No Rhophylac:N/A MMR:N/A - Immune  T-DaP: Declined Flu: Declined  Transfusion:No  Physical exam  Vitals:   05/27/21 2005 05/27/21 2236 05/27/21 2243 05/28/21 0553  BP:   128/73 128/73 140/75  Pulse:  77 68 63  Resp:   18 18  Temp:   98.2 F (36.8 C) 98 F (36.7 C)  TempSrc:   Oral Oral  SpO2: 97%  100% 100%  Weight:      Height:       General: alert Lochia: appropriate Uterine Fundus: firm Incision: Healing well with no significant drainage DVT Evaluation: No evidence of DVT seen on physical exam. Labs: Lab Results  Component Value Date   WBC 21.5 (H) 05/27/2021   HGB 9.1 (L) 05/27/2021   HCT 28.4 (L) 05/27/2021   MCV 83.8 05/27/2021   PLT 175 05/27/2021   CMP Latest Ref Rng & Units 05/25/2021  Glucose 70 - 99 mg/dL 99  BUN 6 - 20 mg/dL 6  Creatinine 0.44 - 1.00 mg/dL 0.60  Sodium 135 - 145 mmol/L 133(L)  Potassium 3.5 - 5.1 mmol/L 3.6  Chloride 98 - 111 mmol/L 102  CO2 22 - 32 mmol/L 19(L)  Calcium 8.9 - 10.3 mg/dL 9.2  Total Protein 6.5 - 8.1 g/dL 7.3  Total Bilirubin 0.3 - 1.2 mg/dL 0.9  Alkaline Phos 38 - 126 U/L 139(H)  AST 15 - 41 U/L 21  ALT 0 - 44 U/L 14   Edinburgh Score: Edinburgh Postnatal Depression Scale Screening  Tool 05/27/2021  I have been able to laugh and see the funny side of things. 0  I have looked forward with enjoyment to things. 0  I have blamed myself unnecessarily when things went wrong. 1  I have been anxious or worried for no good reason. 1  I have felt scared or panicky for no good reason. 0  Things have been getting on top of me. 0  I have been so unhappy that I have had difficulty sleeping. 0  I have felt sad or miserable. 0  I have been so unhappy that I have been crying. 0  The thought of harming myself has occurred to me. 0  Edinburgh Postnatal Depression Scale Total 2     After visit meds:  Allergies as of 05/28/2021   No Known Allergies      Medication List     STOP taking these medications    ondansetron 4 MG disintegrating tablet Commonly known as: Zofran ODT   terconazole 0.8 % vaginal cream Commonly known as: TERAZOL 3   valACYclovir 1000 MG tablet Commonly known as:  Valtrex       TAKE these medications    Blood Pressure Kit Devi 1 kit by Does not apply route once a week.   ibuprofen 600 MG tablet Commonly known as: ADVIL Take 1 tablet (600 mg total) by mouth every 6 (six) hours.   oxyCODONE 5 MG immediate release tablet Commonly known as: Oxy IR/ROXICODONE Take 1-2 tablets (5-10 mg total) by mouth every 4 (four) hours as needed for moderate pain.   PrePLUS 27-1 MG Tabs Take 1 tablet by mouth daily.               Discharge Care Instructions  (From admission, onward)           Start     Ordered   05/28/21 0000  Discharge wound care:       Comments: Remove dressing in 5 days   05/28/21 0749             Discharge home in stable condition Infant Feeding: Bottle Infant Disposition:home with mother Discharge instruction: per After Visit Summary and Postpartum booklet. Activity: Advance as tolerated. Pelvic rest for 6 weeks.  Diet: routine diet Future Appointments: Future Appointments  Date Time Provider [place]  06/03/2021 10:40 AM [place] None  06/23/2021  8:15 AM Shelly Bombard, MD [place] None   Follow up Visit:  [place] Follow up.   Why: 1 week for incision check 4 weeks for PP visit Contact information: [address] [address] [phone]               Message sent to [facility] by Dr. Gwenlyn Perking on 05/26/21.  Please schedule this patient for a In person postpartum visit in 4 weeks with the following provider: MD. Additional Postpartum F/U: Incision check 1 week  High risk pregnancy complicated by:  Hx of CS; uterine rupture Delivery mode:  C-Section, Low Transverse  Anticipated Birth Control:  Depo   05/28/2021 Chancy Milroy, MD

## 2021-05-26 NOTE — Anesthesia Preprocedure Evaluation (Addendum)
Anesthesia Evaluation  Patient identified by MRN, date of birth, ID band Patient awake    Reviewed: Allergy & Precautions, NPO status , Patient's Chart, lab work & pertinent test results  History of Anesthesia Complications Negative for: history of anesthetic complications  Airway Mallampati: II   Neck ROM: Full    Dental   Pulmonary former smoker,    Pulmonary exam normal        Cardiovascular negative cardio ROS Normal cardiovascular exam     Neuro/Psych negative neurological ROS  negative psych ROS   GI/Hepatic GERD  ,(+)     substance abuse  marijuana use,   Endo/Other   Obesity   Renal/GU negative Renal ROS     Musculoskeletal negative musculoskeletal ROS (+)   Abdominal   Peds  Hematology  (+) anemia ,  Plt 214k Alpha thalassemia trait    Anesthesia Other Findings Syphilis HSV   Reproductive/Obstetrics (+) Pregnancy  Hx PIH TOLAC                             Anesthesia Physical Anesthesia Plan  ASA: 2  Anesthesia Plan: Epidural   Post-op Pain Management:    Induction:   PONV Risk Score and Plan: 2 and Treatment may vary due to age or medical condition  Airway Management Planned: Natural Airway  Additional Equipment: None  Intra-op Plan:   Post-operative Plan:   Informed Consent: I have reviewed the patients History and Physical, chart, labs and discussed the procedure including the risks, benefits and alternatives for the proposed anesthesia with the patient or authorized representative who has indicated his/her understanding and acceptance.       Plan Discussed with: Anesthesiologist  Anesthesia Plan Comments: (Labs reviewed. Platelets acceptable, patient not taking any blood thinning medications. Per RN, FHR tracing reported to be stable enough for sitting procedure. Risks and benefits discussed with patient, including PDPH, backache, epidural  hematoma, failed epidural, blood pressure changes, allergic reaction, and nerve injury. Patient expressed understanding and wished to proceed.)        Anesthesia Quick Evaluation

## 2021-05-27 DIAGNOSIS — O9081 Anemia of the puerperium: Secondary | ICD-10-CM | POA: Diagnosis not present

## 2021-05-27 LAB — CBC
HCT: 28.4 % — ABNORMAL LOW (ref 36.0–46.0)
Hemoglobin: 9.1 g/dL — ABNORMAL LOW (ref 12.0–15.0)
MCH: 26.8 pg (ref 26.0–34.0)
MCHC: 32 g/dL (ref 30.0–36.0)
MCV: 83.8 fL (ref 80.0–100.0)
Platelets: 175 10*3/uL (ref 150–400)
RBC: 3.39 MIL/uL — ABNORMAL LOW (ref 3.87–5.11)
RDW: 14.3 % (ref 11.5–15.5)
WBC: 21.5 10*3/uL — ABNORMAL HIGH (ref 4.0–10.5)
nRBC: 0 % (ref 0.0–0.2)

## 2021-05-27 MED ORDER — FERROUS SULFATE 325 (65 FE) MG PO TABS
325.0000 mg | ORAL_TABLET | ORAL | Status: DC
  Administered 2021-05-27: 325 mg via ORAL
  Filled 2021-05-27: qty 1

## 2021-05-27 NOTE — Progress Notes (Signed)
Postpartum Day 1: Cesarean Delivery after uterine rupture at [redacted]w[redacted]d.  Subjective:  Patient reports incisional pain, tolerating PO, + flatus, and no problems voiding.  Bbay is stable at bedside. Bottle feeding.  Moderate lochia.  Objective: Vital signs in last 24 hours: Temp:  [97.7 F (36.5 C)-99.6 F (37.6 C)] 97.7 F (36.5 C) (09/02 0417) Pulse Rate:  [76-108] 76 (09/02 0417) Resp:  [16-19] 18 (09/02 0417) BP: (108-139)/(59-99) 108/59 (09/02 0417) SpO2:  [97 %-100 %] 99 % (09/02 0417)  Physical Exam:  General: alert and no distress Lochia: appropriate Uterine Fundus: firm Incision: pressure dressing in place, no drainage DVT Evaluation: No evidence of DVT seen on physical exam. Negative Homan's sign. No cords or calf tenderness. No significant calf/ankle edema.  Recent Labs    05/25/21 1352 05/27/21 0430  HGB 10.7* 9.1*  HCT 32.5* 28.4*    Assessment/Plan: Status post Cesarean section. Doing well postoperatively.  Discussed uterine rupture, need for contraception in order to prevent pregnancy soon, need to give uterus time to heal. Birth spacing of two years recommended. She desires Depo, will get at discharge.  Also discussed need for earlier cesarean deliveries for any subsequent pregnancies. Continue analgesia Oral iron ordered for mild asymptomatic anemia OOB, ambulation recommended. Patient desires circumcision for her female infant.  Circumcision procedure details discussed, risks and benefits of procedure were also discussed.  These include but are not limited to: Benefits of circumcision in men include reduction in the rates of urinary tract infection (UTI), penile cancer, some sexually transmitted infections, penile inflammatory and retractile disorders, as well as easier hygiene.  Risks include bleeding , infection, injury of glans which may lead to penile deformity or urinary tract issues, unsatisfactory cosmetic appearance and other potential complications related to  the procedure.  It was emphasized that this is an elective procedure.  Patient wants to proceed with circumcision; written informed consent obtained.  Will do circumcision soon, routine circumcision and post circumcision care ordered for the infant. Routine postpartum care.   Jaynie Collins, M.D. 05/27/2021 7:22 AM

## 2021-05-27 NOTE — Lactation Note (Signed)
This note was copied from a baby's chart. Lactation Consultation Note  Patient Name: Tracy Waller [id number] Date: 05/27/2021 Reason for consult: Follow-up assessment Age:29 hours   LC Note:  Spoke with RN after seeing that mother has been formula feeding only.  Per RN, mother initially desired to breast feed, however, she has changed her feeding preference to formula only.     Maternal Data    Feeding Mother's Current Feeding Choice: Formula  LATCH Score                    Lactation Tools Discussed/Used    Interventions    Discharge    Consult Status Consult Status: Complete    Tracy Waller R Tracy Waller 05/27/2021, 4:44 AM

## 2021-05-28 MED ORDER — IBUPROFEN 600 MG PO TABS: 600.0000 mg | ORAL_TABLET | Freq: Four times a day (QID) | ORAL | 1 refills | Status: DC

## 2021-05-28 MED ORDER — OXYCODONE HCL 5 MG PO TABS: 5.0000 mg | ORAL_TABLET | ORAL | 0 refills | Status: DC | PRN

## 2021-05-28 NOTE — Plan of Care (Signed)
  Problem: Education: Goal: Knowledge of General Education information will improve Description: Including pain rating scale, medication(s)/side effects and non-pharmacologic comfort measures Outcome: Completed/Met   Problem: Health Behavior/Discharge Planning: Goal: Ability to manage health-related needs will improve Outcome: Completed/Met   Problem: Clinical Measurements: Goal: Ability to maintain clinical measurements within normal limits will improve Outcome: Completed/Met Goal: Will remain free from infection Outcome: Completed/Met Goal: Diagnostic test results will improve Outcome: Completed/Met Goal: Respiratory complications will improve Outcome: Completed/Met Goal: Cardiovascular complication will be avoided Outcome: Completed/Met   Problem: Activity: Goal: Risk for activity intolerance will decrease Outcome: Completed/Met   Problem: Nutrition: Goal: Adequate nutrition will be maintained Outcome: Completed/Met   Problem: Education: Goal: Knowledge of condition will improve Outcome: Completed/Met Goal: Individualized Educational Video(s) Outcome: Completed/Met Goal: Individualized Newborn Educational Video(s) Outcome: Completed/Met   Problem: Activity: Goal: Will verbalize the importance of balancing activity with adequate rest periods Outcome: Completed/Met Goal: Ability to tolerate increased activity will improve Outcome: Completed/Met   Problem: Coping: Goal: Ability to identify and utilize available resources and services will improve Outcome: Completed/Met   Problem: Life Cycle: Goal: Chance of risk for complications during the postpartum period will decrease Outcome: Completed/Met   Problem: Role Relationship: Goal: Ability to demonstrate positive interaction with newborn will improve Outcome: Completed/Met   Problem: Skin Integrity: Goal: Demonstration of wound healing without infection will improve Outcome: Completed/Met

## 2021-05-29 LAB — T.PALLIDUM AB, TOTAL: T Pallidum Abs: REACTIVE — AB

## 2021-05-31 ENCOUNTER — Telehealth: Payer: Self-pay

## 2021-05-31 LAB — SURGICAL PATHOLOGY

## 2021-05-31 NOTE — Telephone Encounter (Signed)
Transition Care Management Unsuccessful Follow-up Telephone Call  Date of discharge and from where:  05/28/2021 from [facility]  Attempts:  1st Attempt  Reason for unsuccessful TCM follow-up call:  Unable to leave message

## 2021-06-02 NOTE — Telephone Encounter (Signed)
Transition Care Management Follow-up Telephone Call Date of discharge and from where: 05/28/2021 - [facility] How have you been since you were released from the hospital? "Good" Any questions or concerns? No  Items Reviewed: Did the pt receive and understand the discharge instructions provided? Yes  Medications obtained and verified? Yes  Other? No  Any new allergies since your discharge? No  Dietary orders reviewed? No Do you have support at home? Yes    Functional Questionnaire: (I = Independent and D = Dependent) ADLs: I  Bathing/Dressing- I  Meal Prep- I  Eating- I  Maintaining continence- I  Transferring/Ambulation- I  Managing Meds- I  Follow up appointments reviewed:  PCP Hospital f/u appt confirmed? No   Specialist Hospital f/u appt confirmed? Yes  Scheduled to see OBGYN on 06/03/2021 @ 1040 and 06/23/2021 @ 0815. Are transportation arrangements needed? No  If their condition worsens, is the pt aware to call PCP or go to the Emergency Dept.? Yes Was the patient provided with contact information for the PCP's office or ED? Yes Was to pt encouraged to call back with questions or concerns? Yes

## 2021-06-03 ENCOUNTER — Other Ambulatory Visit: Payer: Self-pay

## 2021-06-03 ENCOUNTER — Ambulatory Visit (INDEPENDENT_AMBULATORY_CARE_PROVIDER_SITE_OTHER): Payer: Medicaid Other

## 2021-06-03 VITALS — BP 142/88 | HR 62 | Ht 63.0 in | Wt 167.0 lb

## 2021-06-03 DIAGNOSIS — Z5189 Encounter for other specified aftercare: Secondary | ICD-10-CM

## 2021-06-03 DIAGNOSIS — Z30013 Encounter for initial prescription of injectable contraceptive: Secondary | ICD-10-CM

## 2021-06-03 MED ORDER — MEDROXYPROGESTERONE ACETATE 150 MG/ML IM SUSP: 150.0000 mg | INTRAMUSCULAR | 4 refills | Status: DC

## 2021-06-03 NOTE — Progress Notes (Signed)
Subjective:     Tracy Waller is a 30 y.o. female who presents to the clinic 1 weeks status post  LTCS  for  Incision check . Eating a regular diet without difficulty. Bowel movements are normal. Pain is controlled with current analgesics. Medications being used: ibuprofen (OTC).  Review of Systems Pertinent items are noted in HPI.    Objective:    BP (!) 142/88   Pulse 62   Ht 5\' 3"  (1.6 m)   Wt 167 lb (75.8 kg)   LMP 08/12/2020   Breastfeeding No   BMI 29.58 kg/m  General:  alert  Abdomen: soft, bowel sounds active, non-tender  Incision:   healing well, no drainage, no erythema, no hernia, no seroma, no swelling, no dehiscence, incision well approximated     Assessment:    Doing well postoperatively.   Plan:    1. Continue any current medications. 2. Wound care discussed. 3. Activity restrictions: none 4. Anticipated return to work: not applicable. 5. Follow up: 3 weeks for PP Visit and DEPO Injection.   08/14/2020, RMA

## 2021-06-06 NOTE — Progress Notes (Signed)
Attestation of Attending Supervision of clinical support staff: I agree with the care provided to this patient and was available for any consultation.  I have reviewed the CMA's note and chart, and I agree with the management and plan.  Armonii Sieh MD MPH, ABFM Attending Physician Faculty Practice- [facility]  

## 2021-06-10 ENCOUNTER — Other Ambulatory Visit: Payer: Self-pay

## 2021-06-10 ENCOUNTER — Telehealth (HOSPITAL_COMMUNITY): Payer: Self-pay | Admitting: *Deleted

## 2021-06-10 ENCOUNTER — Observation Stay (HOSPITAL_COMMUNITY)
Admission: AD | Admit: 2021-06-10 | Discharge: 2021-06-11 | Disposition: A | Discharge status: Home / Self Care | Payer: Medicaid Other | Attending: Obstetrics and Gynecology | Admitting: Obstetrics and Gynecology

## 2021-06-10 ENCOUNTER — Encounter (HOSPITAL_COMMUNITY): Payer: Self-pay | Admitting: Obstetrics and Gynecology

## 2021-06-10 DIAGNOSIS — O1490 Unspecified pre-eclampsia, unspecified trimester: Principal | ICD-10-CM | POA: Insufficient documentation

## 2021-06-10 DIAGNOSIS — Z20822 Contact with and (suspected) exposure to covid-19: Secondary | ICD-10-CM | POA: Insufficient documentation

## 2021-06-10 DIAGNOSIS — O165 Unspecified maternal hypertension, complicating the puerperium: Secondary | ICD-10-CM | POA: Diagnosis not present

## 2021-06-10 DIAGNOSIS — O1415 Severe pre-eclampsia, complicating the puerperium: Secondary | ICD-10-CM | POA: Diagnosis not present

## 2021-06-10 DIAGNOSIS — Z87891 Personal history of nicotine dependence: Secondary | ICD-10-CM | POA: Diagnosis not present

## 2021-06-10 LAB — RESP PANEL BY RT-PCR (FLU A&B, COVID) ARPGX2
Influenza A by PCR: NEGATIVE
Influenza B by PCR: NEGATIVE
SARS Coronavirus 2 by RT PCR: NEGATIVE

## 2021-06-10 LAB — COMPREHENSIVE METABOLIC PANEL
ALT: 16 U/L (ref 0–44)
AST: 18 U/L (ref 15–41)
Albumin: 3.4 g/dL — ABNORMAL LOW (ref 3.5–5.0)
Alkaline Phosphatase: 85 U/L (ref 38–126)
Anion gap: 9 (ref 5–15)
BUN: 9 mg/dL (ref 6–20)
CO2: 26 mmol/L (ref 22–32)
Calcium: 9.2 mg/dL (ref 8.9–10.3)
Chloride: 105 mmol/L (ref 98–111)
Creatinine, Ser: 0.9 mg/dL (ref 0.44–1.00)
GFR, Estimated: >60 mL/min (ref >60)
Glucose, Bld: 81 mg/dL (ref 70–99)
Potassium: 3.3 mmol/L — ABNORMAL LOW (ref 3.5–5.1)
Sodium: 140 mmol/L (ref 135–145)
Total Bilirubin: 1.2 mg/dL (ref 0.3–1.2)
Total Protein: 7.7 g/dL (ref 6.5–8.1)

## 2021-06-10 LAB — CBC
HCT: 33.6 % — ABNORMAL LOW (ref 36.0–46.0)
Hemoglobin: 10.4 g/dL — ABNORMAL LOW (ref 12.0–15.0)
MCH: 26.2 pg (ref 26.0–34.0)
MCHC: 31 g/dL (ref 30.0–36.0)
MCV: 84.6 fL (ref 80.0–100.0)
Platelets: 396 10*3/uL (ref 150–400)
RBC: 3.97 MIL/uL (ref 3.87–5.11)
RDW: 14.1 % (ref 11.5–15.5)
WBC: 7.5 10*3/uL (ref 4.0–10.5)
nRBC: 0 % (ref 0.0–0.2)

## 2021-06-10 MED ORDER — MAGNESIUM SULFATE 40 GM/1000ML IV SOLN
2.0000 g/h | INTRAVENOUS | Status: DC
  Administered 2021-06-10: 2 g/h via INTRAVENOUS
  Filled 2021-06-10: qty 1000

## 2021-06-10 MED ORDER — LABETALOL HCL 5 MG/ML IV SOLN
40.0000 mg | INTRAVENOUS | Status: DC | PRN
Start: 2021-06-10 — End: 2021-06-11

## 2021-06-10 MED ORDER — METOCLOPRAMIDE HCL 5 MG/ML IJ SOLN
10.0000 mg | Freq: Once | INTRAMUSCULAR | Status: AC
  Administered 2021-06-10: 10 mg via INTRAVENOUS
  Filled 2021-06-10: qty 2

## 2021-06-10 MED ORDER — IBUPROFEN 600 MG PO TABS: 600.0000 mg | ORAL_TABLET | Freq: Four times a day (QID) | ORAL | Status: DC | PRN

## 2021-06-10 MED ORDER — ONDANSETRON HCL 4 MG/2ML IJ SOLN: 4.0000 mg | INTRAMUSCULAR | Status: DC | PRN

## 2021-06-10 MED ORDER — MAGNESIUM SULFATE BOLUS VIA INFUSION
6.0000 g | Freq: Once | INTRAVENOUS | Status: AC
  Administered 2021-06-10: 6 g via INTRAVENOUS
  Filled 2021-06-10: qty 1000

## 2021-06-10 MED ORDER — SIMETHICONE 80 MG PO CHEW: 80.0000 mg | CHEWABLE_TABLET | ORAL | Status: DC | PRN

## 2021-06-10 MED ORDER — LACTATED RINGERS IV SOLN: Freq: Once | INTRAVENOUS | Status: AC

## 2021-06-10 MED ORDER — PRENATAL MULTIVITAMIN CH
1.0000 | ORAL_TABLET | Freq: Every day | ORAL | Status: DC
  Administered 2021-06-11: 1 via ORAL
  Filled 2021-06-10: qty 1

## 2021-06-10 MED ORDER — DIBUCAINE (PERIANAL) 1 % EX OINT: 1.0000 "application " | TOPICAL_OINTMENT | CUTANEOUS | Status: DC | PRN

## 2021-06-10 MED ORDER — ENOXAPARIN SODIUM 40 MG/0.4ML IJ SOSY
40.0000 mg | PREFILLED_SYRINGE | INTRAMUSCULAR | Status: DC
  Administered 2021-06-10: 40 mg via SUBCUTANEOUS
  Filled 2021-06-10: qty 0.4

## 2021-06-10 MED ORDER — LACTATED RINGERS IV SOLN: INTRAVENOUS | Status: DC

## 2021-06-10 MED ORDER — HYDRALAZINE HCL 20 MG/ML IJ SOLN: 10.0000 mg | INTRAMUSCULAR | Status: DC | PRN

## 2021-06-10 MED ORDER — POLYETHYLENE GLYCOL 3350 17 G PO PACK
17.0000 g | PACK | Freq: Every day | ORAL | Status: DC
  Filled 2021-06-10: qty 1

## 2021-06-10 MED ORDER — DIPHENHYDRAMINE HCL 50 MG/ML IJ SOLN
25.0000 mg | Freq: Once | INTRAMUSCULAR | Status: AC
  Administered 2021-06-10: 25 mg via INTRAVENOUS
  Filled 2021-06-10: qty 1

## 2021-06-10 MED ORDER — DEXAMETHASONE SODIUM PHOSPHATE 10 MG/ML IJ SOLN
10.0000 mg | Freq: Once | INTRAMUSCULAR | Status: AC
  Administered 2021-06-10: 10 mg via INTRAVENOUS
  Filled 2021-06-10: qty 1

## 2021-06-10 MED ORDER — WITCH HAZEL-GLYCERIN EX PADS: 1.0000 "application " | MEDICATED_PAD | CUTANEOUS | Status: DC | PRN

## 2021-06-10 MED ORDER — ACETAMINOPHEN 325 MG PO TABS: 650.0000 mg | ORAL_TABLET | ORAL | Status: DC | PRN

## 2021-06-10 MED ORDER — LABETALOL HCL 5 MG/ML IV SOLN
20.0000 mg | INTRAVENOUS | Status: DC | PRN
Start: 2021-06-10 — End: 2021-06-11

## 2021-06-10 MED ORDER — BENZOCAINE-MENTHOL 20-0.5 % EX AERO: 1.0000 "application " | INHALATION_SPRAY | CUTANEOUS | Status: DC | PRN

## 2021-06-10 MED ORDER — NIFEDIPINE ER OSMOTIC RELEASE 30 MG PO TB24
30.0000 mg | ORAL_TABLET | Freq: Every day | ORAL | Status: DC
  Administered 2021-06-10 – 2021-06-11 (×2): 30 mg via ORAL
  Filled 2021-06-10 (×2): qty 1

## 2021-06-10 MED ORDER — MAGNESIUM SULFATE 40 GM/1000ML IV SOLN: 2.0000 g/h | INTRAVENOUS | Status: DC

## 2021-06-10 MED ORDER — LABETALOL HCL 5 MG/ML IV SOLN: 40.0000 mg | INTRAVENOUS | Status: DC | PRN

## 2021-06-10 MED ORDER — OXYCODONE HCL 5 MG PO TABS
5.0000 mg | ORAL_TABLET | Freq: Four times a day (QID) | ORAL | Status: DC | PRN
  Filled 2021-06-10: qty 1

## 2021-06-10 MED ORDER — TETANUS-DIPHTH-ACELL PERTUSSIS 5-2.5-18.5 LF-MCG/0.5 IM SUSY: 0.5000 mL | PREFILLED_SYRINGE | Freq: Once | INTRAMUSCULAR | Status: DC

## 2021-06-10 MED ORDER — DIPHENHYDRAMINE HCL 25 MG PO CAPS: 25.0000 mg | ORAL_CAPSULE | Freq: Four times a day (QID) | ORAL | Status: DC | PRN

## 2021-06-10 MED ORDER — LABETALOL HCL 5 MG/ML IV SOLN: 80.0000 mg | INTRAVENOUS | Status: DC | PRN

## 2021-06-10 MED ORDER — LABETALOL HCL 5 MG/ML IV SOLN: 20.0000 mg | INTRAVENOUS | Status: DC | PRN

## 2021-06-10 MED ORDER — ONDANSETRON HCL 4 MG PO TABS: 4.0000 mg | ORAL_TABLET | ORAL | Status: DC | PRN

## 2021-06-10 MED ORDER — COCONUT OIL OIL: 1.0000 "application " | TOPICAL_OIL | Status: DC | PRN

## 2021-06-10 NOTE — H&P (Signed)
Tracy Waller is a 30 y.o. female admitted for Severe Preeclampsia in the postpartum period.  She is s/p repeat cesarean on 05/26/2021. Patient endorses new onset mild blood pressure elevation at her /incision check on 06/03/2021. She reports blood pressure reading of 170s/90s as obtained by a home health aid earlier today. She reports recurrent bilateral anterior headache for the past week. On arrival to MAU this evening her headache pain score is 8.5/10. It improved to 7/10 with IV medication. She also endorses new onset spots In her field of vision today. Those visual disturbances resolved without intervention around 1800 hours.  Patient received prenatal care with [facility].  OB History     Gravida  5   Para  4   Term  4   Preterm      AB  1   Living  4      SAB  1   IAB      Ectopic      Multiple  0   Live Births  4          Past Medical History:  Diagnosis Date   Chlamydia    Hx of chlamydia infection 2017   Hx of trichomoniasis 2017   Neurosyphilis    Syphilis    Past Surgical History:  Procedure Laterality Date   CESAREAN SECTION     CESAREAN SECTION  05/26/2021   Procedure: CESAREAN SECTION;  Surgeon: Federico Flake, MD;  Location: [facility] LD ORS;  Service: Obstetrics;;   Family History: family history includes Asthma in her father; Hypertension in her father, mother, and sister. Social History:  reports that she has quit smoking. Her smoking use included cigarettes. She has never used smokeless tobacco. She reports that she does not currently use alcohol. She reports current drug use. Drug: Marijuana.  Review of Systems  Eyes:  Positive for visual disturbance.  Neurological:  Positive for headaches.  All other systems reviewed and are negative.    Blood pressure 140/71, pulse 60, temperature 99.4 F (37.4 C), temperature source Oral, resp. rate 16, SpO2 97 %, not currently breastfeeding.  Physical Exam Vitals and nursing note reviewed. Exam  conducted with a chaperone present.  Constitutional:      Appearance: Normal appearance.  Cardiovascular:     Rate and Rhythm: Normal rate and regular rhythm.     Pulses: Normal pulses.     Heart sounds: Normal heart sounds.  Pulmonary:     Effort: Pulmonary effort is normal.     Breath sounds: Normal breath sounds.  Skin:    Capillary Refill: Capillary refill takes less than 2 seconds.  Neurological:     Mental Status: She is alert.  Psychiatric:        Mood and Affect: Mood normal.        Behavior: Behavior normal.        Thought Content: Thought content normal.        Judgment: Judgment normal.    Prenatal labs: ABO, Rh: --/--/O POS (08/31 1352) Antibody: NEG (08/31 1352) Rubella: 1.59 (02/03 1609) RPR: Reactive (08/31 1352)  HBsAg: Negative (02/03 1609)  HIV: Non Reactive (07/13 0943)  GBS: Positive/-- (08/03 0442)   Patient Vitals for the past 24 hrs:  BP Temp Temp src Pulse Resp SpO2  06/10/21 2100 129/77 -- -- 67 -- --  06/10/21 2045 140/71 -- -- 60 -- --  06/10/21 2030 (!) 141/73 -- -- 64 -- --  06/10/21 2017 128/72 -- -- 65 -- --  06/10/21 2001 121/62 -- -- 71 -- --  06/10/21 1945 (!) 164/92 -- -- 65 -- --  06/10/21 1930 (!) 151/94 99.4 F (37.4 C) Oral 68 16 --  06/10/21 1915 (!) 149/87 -- -- 60 -- --  06/10/21 1910 (!) 155/107 -- -- 68 -- --  06/10/21 1850 (!) 140/95 98.1 F (36.7 C) -- 71 18 97 %   Results for orders placed or performed during the hospital encounter of 06/10/21 (from the past 24 hour(s))  CBC     Status: Abnormal   Collection Time: 06/10/21  7:16 PM  Result Value Ref Range   WBC 7.5 4.0 - 10.5 K/uL   RBC 3.97 3.87 - 5.11 MIL/uL   Hemoglobin 10.4 (L) 12.0 - 15.0 g/dL   HCT 98.3 (L) 38.2 - 50.5 %   MCV 84.6 80.0 - 100.0 fL   MCH 26.2 26.0 - 34.0 pg   MCHC 31.0 30.0 - 36.0 g/dL   RDW 39.7 67.3 - 41.9 %   Platelets 396 150 - 400 K/uL   nRBC 0.0 0.0 - 0.2 %  Comprehensive metabolic panel     Status: Abnormal   Collection Time:  06/10/21  7:16 PM  Result Value Ref Range   Sodium 140 135 - 145 mmol/L   Potassium 3.3 (L) 3.5 - 5.1 mmol/L   Chloride 105 98 - 111 mmol/L   CO2 26 22 - 32 mmol/L   Glucose, Bld 81 70 - 99 mg/dL   BUN 9 6 - 20 mg/dL   Creatinine, Ser 3.79 0.44 - 1.00 mg/dL   Calcium 9.2 8.9 - 02.4 mg/dL   Total Protein 7.7 6.5 - 8.1 g/dL   Albumin 3.4 (L) 3.5 - 5.0 g/dL   AST 18 15 - 41 U/L   ALT 16 0 - 44 U/L   Alkaline Phosphatase 85 38 - 126 U/L   Total Bilirubin 1.2 0.3 - 1.2 mg/dL   GFR, Estimated >09 >73 mL/min   Anion gap 9 5 - 15   Assessment/Plan: --31 y.o. [id number] s/p repeat cesarean on 09/01 --Severe Preeclampsia in the postpartum period --Per Dr. Vergie Living, admit to [facility] for Magnesium Sulfate administration  Calvert Cantor, CNM 06/10/2021, 9:28 PM

## 2021-06-10 NOTE — Telephone Encounter (Signed)
No answer, so no message left.  Duffy Rhody, RN 06-10-2021 at 10:17am

## 2021-06-10 NOTE — MAU Note (Signed)
C/s on 9/1. Started getting a real bad HA.  Went to dr last wk, BP was up. Nurse came to house 2 hrs ago, BP was sky high 170/?. Sees little spots from time to time.  Has pain in upper abd, comes and goes- changes side. Denies swelling. Did not take anything for HA

## 2021-06-11 LAB — CBC
HCT: 37.3 % (ref 36.0–46.0)
Hemoglobin: 11.9 g/dL — ABNORMAL LOW (ref 12.0–15.0)
MCH: 26.5 pg (ref 26.0–34.0)
MCHC: 31.9 g/dL (ref 30.0–36.0)
MCV: 83.1 fL (ref 80.0–100.0)
Platelets: 436 10*3/uL — ABNORMAL HIGH (ref 150–400)
RBC: 4.49 MIL/uL (ref 3.87–5.11)
RDW: 14 % (ref 11.5–15.5)
WBC: 5.6 10*3/uL (ref 4.0–10.5)
nRBC: 0 % (ref 0.0–0.2)

## 2021-06-11 LAB — COMPREHENSIVE METABOLIC PANEL
ALT: 16 U/L (ref 0–44)
AST: 19 U/L (ref 15–41)
Albumin: 3.2 g/dL — ABNORMAL LOW (ref 3.5–5.0)
Alkaline Phosphatase: 79 U/L (ref 38–126)
Anion gap: 11 (ref 5–15)
BUN: 9 mg/dL (ref 6–20)
CO2: 22 mmol/L (ref 22–32)
Calcium: 8.4 mg/dL — ABNORMAL LOW (ref 8.9–10.3)
Chloride: 102 mmol/L (ref 98–111)
Creatinine, Ser: 0.84 mg/dL (ref 0.44–1.00)
GFR, Estimated: >60 mL/min (ref >60)
Glucose, Bld: 117 mg/dL — ABNORMAL HIGH (ref 70–99)
Potassium: 4 mmol/L (ref 3.5–5.1)
Sodium: 135 mmol/L (ref 135–145)
Total Bilirubin: 0.8 mg/dL (ref 0.3–1.2)
Total Protein: 7.8 g/dL (ref 6.5–8.1)

## 2021-06-11 MED ORDER — NIFEDIPINE ER 30 MG PO TB24: 30.0000 mg | ORAL_TABLET | Freq: Every day | ORAL | 6 refills | Status: DC

## 2021-06-11 NOTE — Progress Notes (Signed)
Called to bedside as patient states she needs to leave.  Pt reports she is having issues with childcare and needs to leave.  Asked pt if there was anything we could do to try and be of assistance, including speak with transition of care team/help to find resources.  Pt reports that this would not help.  Advised pt that if she leaves that this would be against medical advice.  Discussed risk of postpartum preeclampsia, elevated blood pressures and potential concern for stroke or seizure.  Pt voiced understanding and desires to leave.  AMA paperwork completed.  Advised pt to at a minimum pick up prescription for Procardia daily starting tomorrow (today's dose was given) and follow up in the office in one week.  Myna Hidalgo, DO Attending Obstetrician & Gynecologist, [facility] for [employer], [facility]

## 2021-06-11 NOTE — Progress Notes (Addendum)
RN called to room at patient request. Nurse tech notified RN of patient's intent to leave. RN arrived at bedside immediately with OB Dr. Charlotta Newton. Patient was educated at length with OB but insisted leaving AMA due to childcare issues. Pt declined social work consult and stated did not need any other support at this time. Patient verbalized understanding and signed AMA form. IV was removed and patient is waiting for ride.

## 2021-06-12 ENCOUNTER — Telehealth: Payer: Self-pay

## 2021-06-12 NOTE — Telephone Encounter (Signed)
Transition Care Management Unsuccessful Follow-up Telephone Call  Date of discharge and from where:  06/11/2021 from [facility]  Attempts:  1st Attempt  Reason for unsuccessful TCM follow-up call:  Unable to leave message    

## 2021-06-13 NOTE — Telephone Encounter (Signed)
Transition Care Management Follow-up Telephone Call Date of discharge and from where: 06/11/2021 from [facility] How have you been since you were released from the hospital? Pt stated that she is feeling better and did not have any questions or concerns at this time.  Any questions or concerns? No  Items Reviewed: Did the pt receive and understand the discharge instructions provided? Yes  Medications obtained and verified? Yes  Other? No  Any new allergies since your discharge? No  Dietary orders reviewed? No Do you have support at home? Yes   Functional Questionnaire: (I = Independent and D = Dependent) ADLs: I  Bathing/Dressing- I  Meal Prep- I  Eating- I  Maintaining continence- I  Transferring/Ambulation- I  Managing Meds- I   Follow up appointments reviewed:  PCP Hospital f/u appt confirmed? No   Specialist Hospital f/u appt confirmed? Yes  Scheduled to see [facility] Nurse on 06/14/2021 @ 3:00pm. Are transportation arrangements needed? No  If their condition worsens, is the pt aware to call PCP or go to the Emergency Dept.? Yes Was the patient provided with contact information for the PCP's office or ED? Yes Was to pt encouraged to call back with questions or concerns? Yes

## 2021-06-14 ENCOUNTER — Other Ambulatory Visit: Payer: Self-pay

## 2021-06-14 ENCOUNTER — Ambulatory Visit (INDEPENDENT_AMBULATORY_CARE_PROVIDER_SITE_OTHER): Payer: Medicaid Other

## 2021-06-14 DIAGNOSIS — Z0130 Encounter for examination of blood pressure without abnormal findings: Secondary | ICD-10-CM

## 2021-06-14 NOTE — Progress Notes (Signed)
Subjective:  Tracy Waller is a 30 y.o. female here for BP check.   Hypertension ROS: taking medications as instructed, no medication side effects noted, no TIA's, no chest pain on exertion, no dyspnea on exertion, and no swelling of ankles.    Objective:  BP 118/75   Pulse 99   Wt 158 lb 11.2 oz (72 kg)   BMI 28.11 kg/m   Appearance alert, well appearing, and in no distress. General exam BP noted to be well controlled today in office.    Assessment:   Blood Pressure well controlled.   Plan:  Current treatment plan is effective, no change in therapy.Marland Kitchen

## 2021-06-14 NOTE — Progress Notes (Signed)
Subjective:  Tracy Waller is a 30 y.o. female here for BP check.   Hypertension ROS: taking medications as instructed, no medication side effects noted, no TIA's, no chest pain on exertion, no dyspnea on exertion, and no swelling of ankles.    Objective:  Wt 158 lb 11.2 oz (72 kg)   BMI 28.11 kg/m   Appearance alert, well appearing, and in no distress. General exam BP noted to be well controlled today in office.    Assessment:   Blood Pressure well controlled.   Plan:  Current treatment plan is effective, no change in therapy..  Patient to follow up at postpartum visit

## 2021-06-23 ENCOUNTER — Ambulatory Visit: Payer: Medicaid Other | Admitting: Obstetrics

## 2021-06-24 NOTE — Discharge Summary (Signed)
Physician Discharge Summary  Patient ID: Tracy Waller MRN: [id number] DOB/AGE: Nov 17, 1990 30 y.o.  Admit date: 06/10/2021 Discharge date: 06/11/2021  Admission Diagnoses: Postpartum preeclampsia  Discharge Diagnoses:  Same as above Discharged Condition: fair  Hospital Course: 30yo G2P2 admitted due to postpartum preeclampsia.  She was started on IV Magnesium and blood pressure management.  The following day, pt requested to be discharged due to childcare concerns. At that time, she had not completed 23hrs of magnesium and was advised for further in-hospital evaluation of her blood pressure.  Pt declined and left AMA.  Prescription of Procardia was sent in for outpatient management of blood pressure management and encouraged close outpatient follow up.  Consults: None  Significant Diagnostic Studies: labs:  CBC Latest Ref Rng & Units 06/11/2021 06/10/2021 05/27/2021  WBC 4.0 - 10.5 K/uL 5.6 7.5 21.5(H)  Hemoglobin 12.0 - 15.0 g/dL 11.9(L) 10.4(L) 9.1(L)  Hematocrit 36.0 - 46.0 % 37.3 33.6(L) 28.4(L)  Platelets 150 - 400 K/uL 436(H) 396 175     Treatments: IV magnesium, anti-hypertensives  Discharge Exam: Blood pressure 126/86, pulse 93, temperature 97.7 F (36.5 C), temperature source Oral, resp. rate 18, SpO2 98 %, not currently breastfeeding. Gen: NAD Psych: mood and behavior appropriate Full exam not completed  Disposition:  There are no questions and answers to display.         Allergies as of 06/11/2021   No Known Allergies      Medication List     TAKE these medications    Blood Pressure Kit Devi 1 kit by Does not apply route once a week.   ibuprofen 600 MG tablet Commonly known as: ADVIL Take 1 tablet (600 mg total) by mouth every 6 (six) hours.   medroxyPROGESTERone 150 MG/ML injection Commonly known as: DEPO-PROVERA Inject 1 mL (150 mg total) into the muscle every 3 (three) months.   NIFEdipine 30 MG 24 hr tablet Commonly known as: ADALAT CC Take  1 tablet (30 mg total) by mouth daily.   PrePLUS 27-1 MG Tabs Take 1 tablet by mouth daily.         Signed: Annalee Genta 06/24/2021, 6:11 PM

## 2021-06-28 ENCOUNTER — Encounter: Payer: Self-pay | Admitting: Obstetrics and Gynecology

## 2021-06-28 ENCOUNTER — Other Ambulatory Visit: Payer: Self-pay

## 2021-06-28 ENCOUNTER — Ambulatory Visit (INDEPENDENT_AMBULATORY_CARE_PROVIDER_SITE_OTHER): Payer: Medicaid Other | Admitting: Obstetrics and Gynecology

## 2021-06-28 ENCOUNTER — Other Ambulatory Visit (HOSPITAL_COMMUNITY)
Admission: RE | Admit: 2021-06-28 | Discharge: 2021-06-28 | Disposition: A | Discharge status: Home / Self Care | Payer: Medicaid Other | Source: Ambulatory Visit | Attending: Obstetrics and Gynecology | Admitting: Obstetrics and Gynecology

## 2021-06-28 VITALS — BP 137/86 | HR 74 | Wt 158.4 lb

## 2021-06-28 DIAGNOSIS — I10 Essential (primary) hypertension: Secondary | ICD-10-CM | POA: Diagnosis not present

## 2021-06-28 DIAGNOSIS — Z3042 Encounter for surveillance of injectable contraceptive: Secondary | ICD-10-CM

## 2021-06-28 DIAGNOSIS — N898 Other specified noninflammatory disorders of vagina: Secondary | ICD-10-CM

## 2021-06-28 DIAGNOSIS — Z98891 History of uterine scar from previous surgery: Secondary | ICD-10-CM | POA: Diagnosis not present

## 2021-06-28 LAB — POCT URINE PREGNANCY: Preg Test, Ur: NEGATIVE

## 2021-06-28 MED ORDER — MEDROXYPROGESTERONE ACETATE 150 MG/ML IM SUSP
150.0000 mg | Freq: Once | INTRAMUSCULAR | Status: AC
  Administered 2021-06-28: 150 mg via INTRAMUSCULAR

## 2021-06-28 NOTE — Progress Notes (Signed)
..   Post Partum Visit Note  Tracy Waller is a 30 y.o. [phone] female who presents for a postpartum visit. She is 5 weeks postpartum following a repeat cesarean section.  I have fully reviewed the prenatal and intrapartum course. The delivery was at 41.0 gestational weeks.  Anesthesia: epidural. Postpartum course has been good. Baby is doing well. Baby is feeding by bottle - Similac Advance. Bleeding no bleeding. Bowel function is normal. Bladder function is normal. Patient is not sexually active. Contraception method is none. Postpartum depression screening: negative.   The pregnancy intention screening data noted above was reviewed. Potential methods of contraception were discussed. The patient elected to proceed with No data recorded.   Edinburgh Postnatal Depression Scale - 06/28/21 1428       Edinburgh Postnatal Depression Scale:  In the Past 7 Days   I have been able to laugh and see the funny side of things. 0    I have looked forward with enjoyment to things. 0    I have blamed myself unnecessarily when things went wrong. 0    I have been anxious or worried for no good reason. 0    I have felt scared or panicky for no good reason. 0    Things have been getting on top of me. 2    I have been so unhappy that I have had difficulty sleeping. 0    I have felt sad or miserable. 0    I have been so unhappy that I have been crying. 0    The thought of harming myself has occurred to me. 0    Edinburgh Postnatal Depression Scale Total 2             Health Maintenance Due  Topic Date Due   COVID-19 Vaccine (1) Never done   INFLUENZA VACCINE  Never done    The following portions of the patient's history were reviewed and updated as appropriate: allergies, current medications, past family history, past medical history, past social history, past surgical history, and problem list.  Review of Systems Pertinent items are noted in HPI.  Objective:  BP 137/86   Pulse 74   Wt  158 lb 6.4 oz (71.8 kg)   Breastfeeding No   BMI 28.06 kg/m    General:  alert, cooperative, appears stated age, and no distress   Breasts:  not indicated  Lungs: clear to auscultation bilaterally  Heart:  regular rate and rhythm  Abdomen: soft, non-tender; bowel sounds normal; no masses,  no organomegaly   Wound well approximated incision, clean dry and intact  GU exam:  not indicated       Assessment:     normal postpartum exam.   Plan:   Essential components of care per ACOG recommendations:  1.  Mood and well being: Patient with negative depression screening today. Reviewed local resources for support.  - Patient tobacco use? Yes. Patient desires to quit? Yes.Discussed reduction and cessation  - hx of drug use? Yes. Discussed support systems and outpatient/inpatient treatment options.  THC only, social use  2. Infant care and feeding:  -Patient currently breastmilk feeding? No.  -Social determinants of health (SDOH) reviewed in EPIC. No concerns.  3. Sexuality, contraception and birth spacing - Patient does not want a pregnancy in the next year.  Desired family size is 5 children.  - Reviewed forms of contraception in tiered fashion. Patient desired Depo-Provera today.   - Discussed birth spacing of 18 months  4. Sleep and fatigue -Encouraged family/partner/community support of 4 hrs of uninterrupted sleep to help with mood and fatigue  5. Physical Recovery  - Discussed patients delivery and complications. She describes her labor as good. - Patient had a C-section repeat; no problems after deliver - Patient has urinary incontinence? No. - Patient is safe to resume physical and sexual activity  6.  Health Maintenance - HM due items addressed Yes - Last pap smear  Diagnosis  Date Value Ref Range Status  11/12/2019      - Negative for intraepithelial lesion or malignancy (NILM)   Pap smear not done at today's visit.  -Breast Cancer screening indicated? No.   7.  Chronic Disease/Pregnancy Condition follow up: Hypertension  Pt had readmit for postpartum preeclampsia.  Low grade HTN persists without medication, will refer to [facility] medicine for continued surveillance.  - PCP follow up   Virtual visit in 6 weeks for discussion of smoking cessation Warden Fillers, MD Center for [employer], [facility]

## 2021-06-29 LAB — CERVICOVAGINAL ANCILLARY ONLY
Bacterial Vaginitis (gardnerella): NEGATIVE
Candida Glabrata: NEGATIVE
Candida Vaginitis: NEGATIVE
Chlamydia: NEGATIVE
Comment: NEGATIVE
Comment: NEGATIVE
Comment: NEGATIVE
Comment: NEGATIVE
Comment: NEGATIVE
Comment: NORMAL
Neisseria Gonorrhea: NEGATIVE
Trichomonas: POSITIVE — AB

## 2021-06-30 ENCOUNTER — Telehealth: Payer: Self-pay

## 2021-06-30 MED ORDER — METRONIDAZOLE 500 MG PO TABS: 500.0000 mg | ORAL_TABLET | Freq: Two times a day (BID) | ORAL | 0 refills | Status: DC

## 2021-06-30 NOTE — Telephone Encounter (Signed)
-----   Message from Warden Fillers, MD sent at 06/30/2021 11:52 AM EDT ----- Trichomonas noted on swab, will offer treatment

## 2021-06-30 NOTE — Telephone Encounter (Signed)
Patient tested positive for trich. She would like to be treated at this time. Advised patient not to have intercourse for 7-14 days until both parties have been treated. TOC in four weeks, patient will call back to schedule.

## 2021-07-21 ENCOUNTER — Ambulatory Visit: Payer: Medicaid Other | Admitting: Obstetrics

## 2021-08-09 ENCOUNTER — Telehealth: Payer: Medicaid Other | Admitting: Obstetrics and Gynecology

## 2021-08-09 NOTE — Progress Notes (Signed)
Several attempts made to reach patient for virtual appointment, without success.

## 2021-09-12 DIAGNOSIS — B351 Tinea unguium: Secondary | ICD-10-CM | POA: Diagnosis not present

## 2021-09-12 DIAGNOSIS — L603 Nail dystrophy: Secondary | ICD-10-CM | POA: Diagnosis not present

## 2021-11-07 IMAGING — US US MFM OB FOLLOW-UP
1 series · 14 of 28 positions shown · non-contrast
Comparison: none

[Series 1: us mfm ob follow-up · 37 acquisitions, 14 frames shown]
[im 2/37]
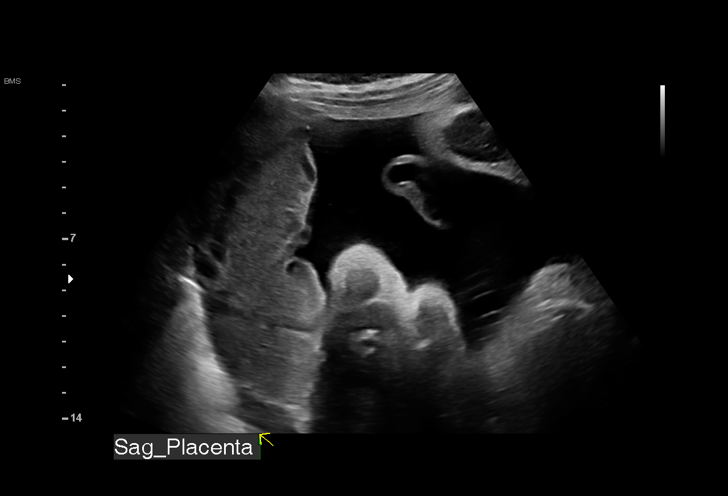
[im 5/37]
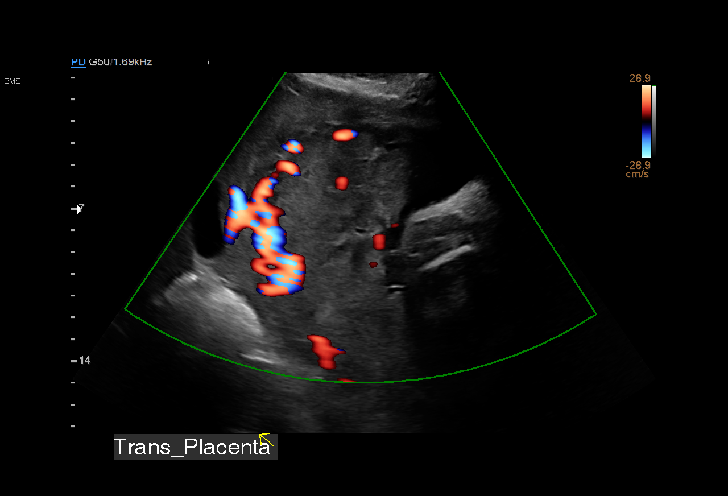
[im 7/37]
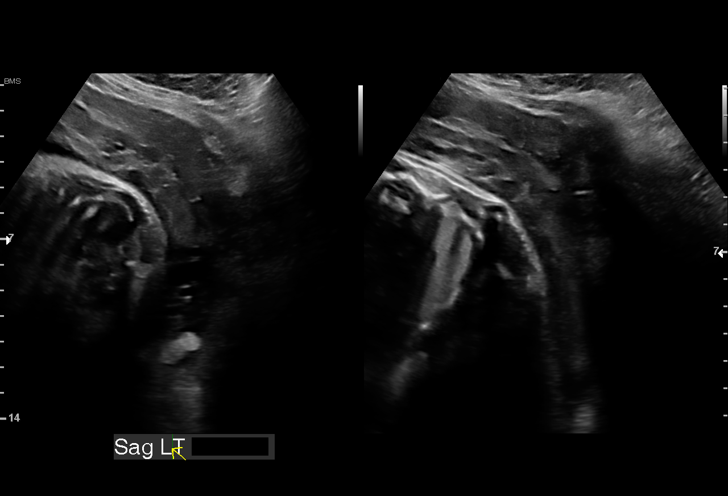
[im 10/37]
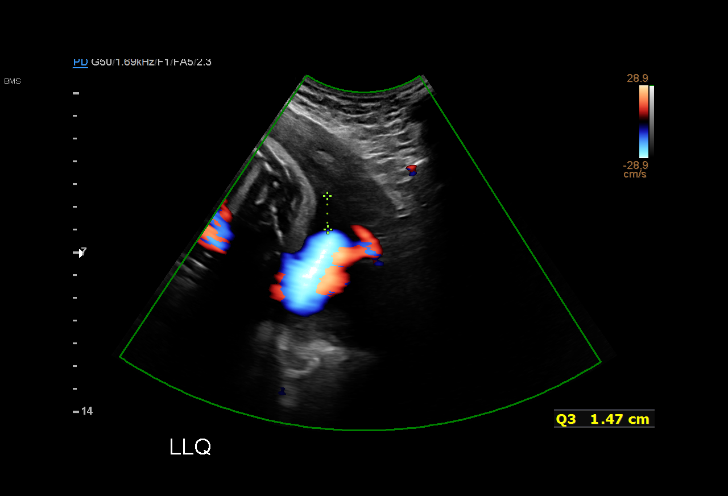
[im 13/37]
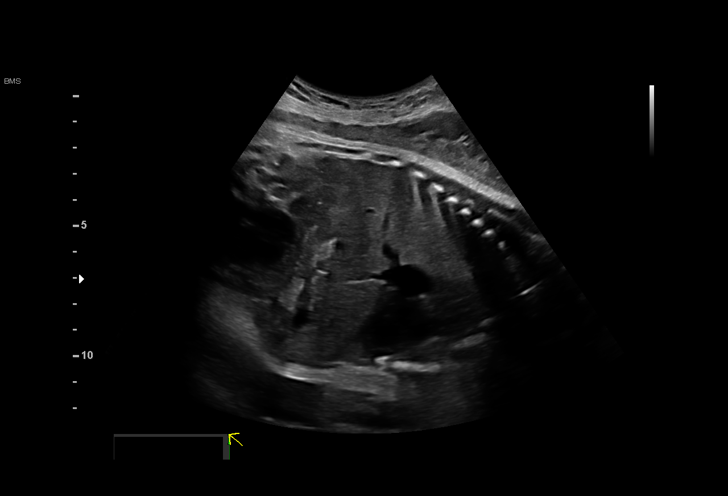
[im 15/37]
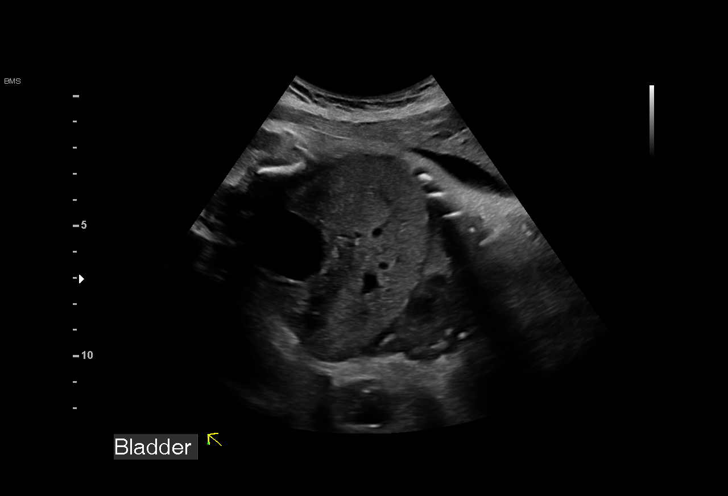
[im 18/37]
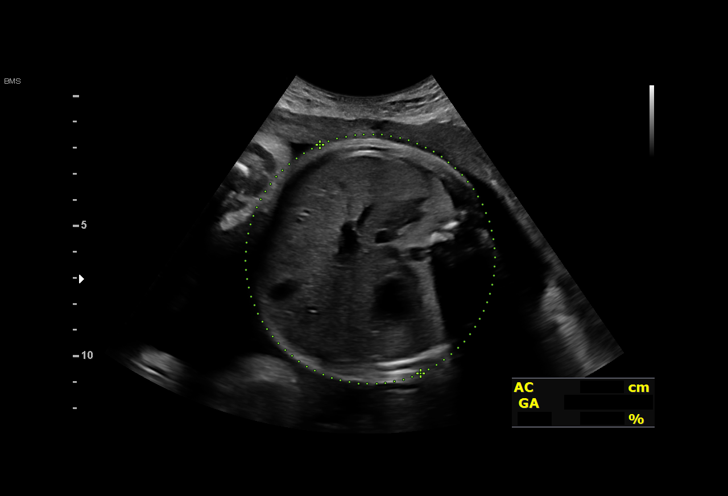
[im 21/37]
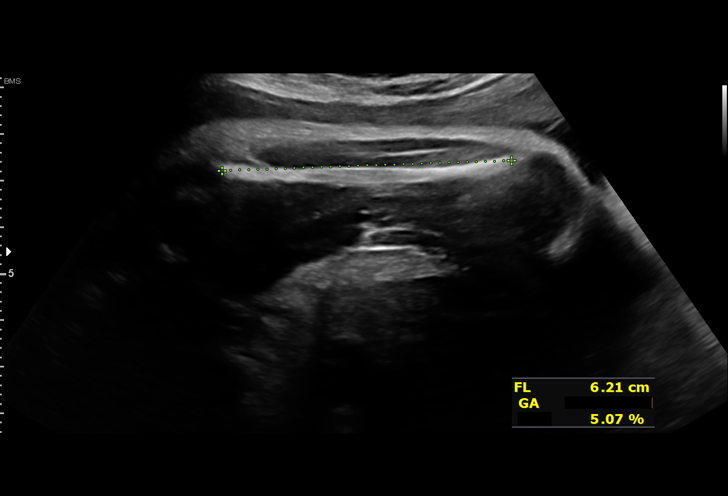
[im 23/37]
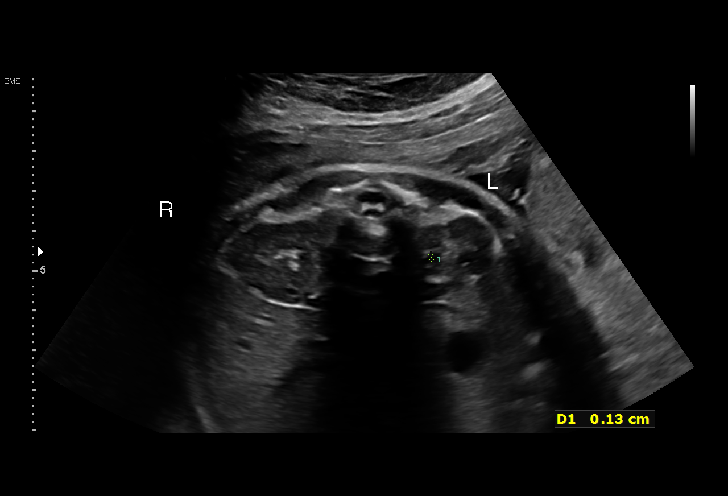
[im 26/37]
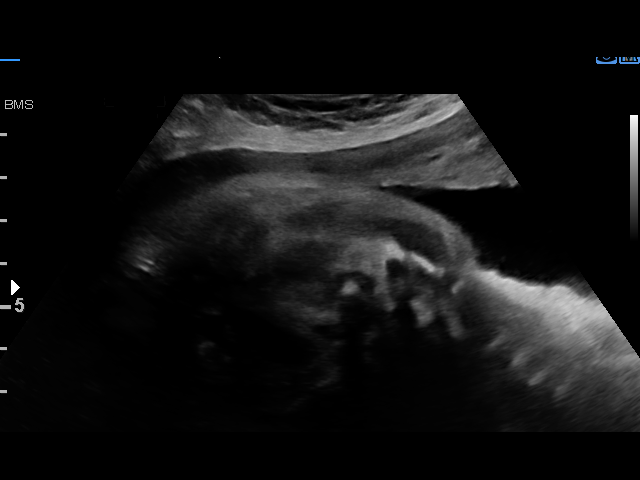
[im 29/37]
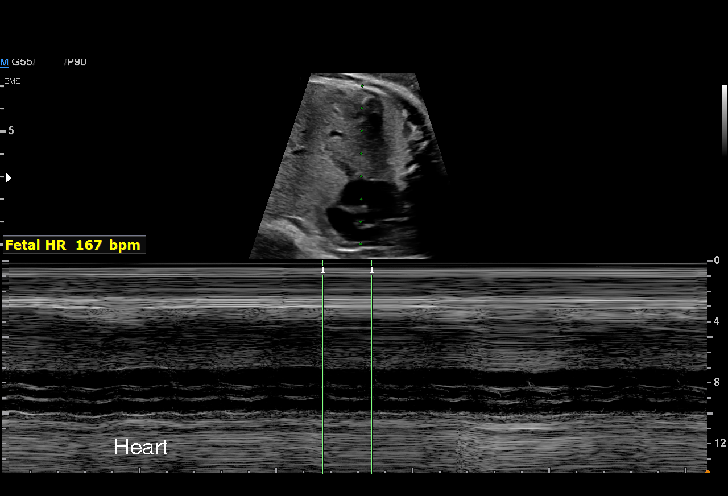
[im 31/37]
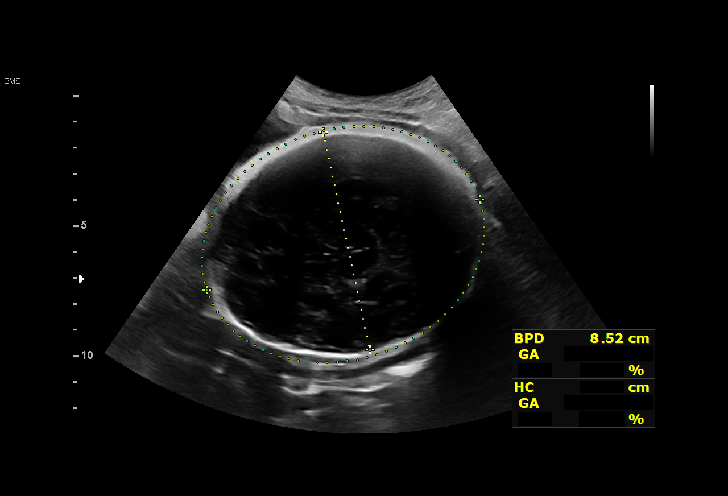
[im 34/37]
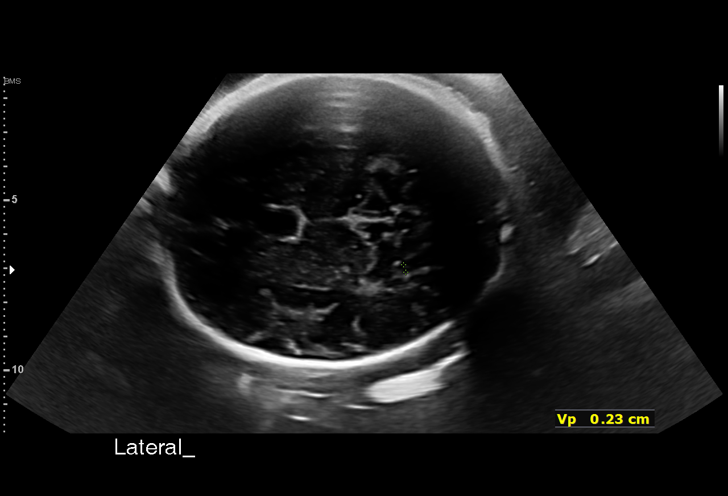
[im 37/37]
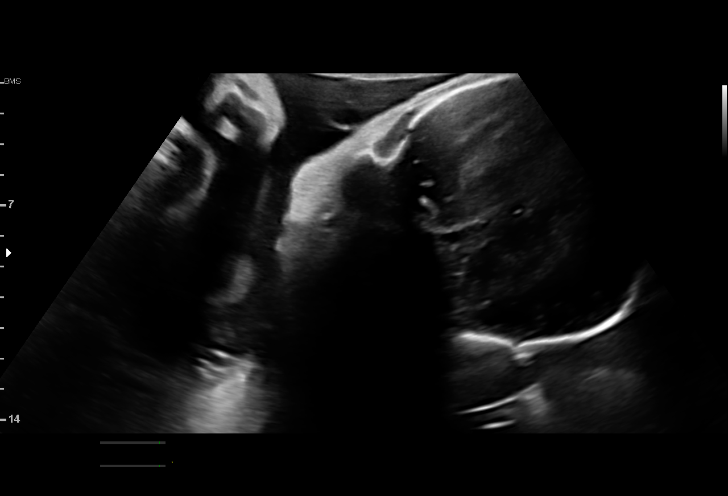

[14 of 28 positions shown; findings below may reference images not displayed]

Indications

 Abnormal finding on antenatal screening
 (Low risk NIPS - atypical sex chromosomes)
 Genetic carrier (silent [place]se[place]) - had EMMANUEL ESSEL
 Previous cesarean delivery, antepartum
 (successful VBAC)
 Poor obstetric history: Previous
 preeclampsia / eclampsia/gestational HTN
 34 weeks gestation of pregnancy
Fetal Evaluation

 Num Of Fetuses:          1
 Fetal Heart Rate(bpm):   167
 Cardiac Activity:        Observed
 Presentation:            Cephalic
 Placenta:                Posterior
 P. Cord Insertion:       Visualized, central

 Amniotic Fluid
 AFI FV:      Within normal limits

 AFI Sum(cm)     %Tile       Largest Pocket(cm)
 14.23           50

 RUQ(cm)       RLQ(cm)       LUQ(cm)        LLQ(cm)

Biometry

 BPD:      85.9  mm     G. Age:  34w 4d         63  %    CI:        76.22   %    70 - 86
                                                         FL/HC:       19.8  %    19.4 -
 HC:      311.8  mm     G. Age:  34w 6d         32  %    HC/AC:       1.05       0.96 -
 AC:      297.7  mm     G. Age:  33w 5d         42  %    FL/BPD:      71.9  %    71 - 87
 FL:       61.8  mm     G. Age:  32w 0d          4  %    FL/AC:       20.8  %    20 - 24
 LV:        2.3  mm

 Est. FW:    0014   gm    4 lb 14 oz     25  %
OB History

 Gravidity:    5         Term:   3        Prem:   0        SAB:   1
 TOP:          0       Ectopic:  0        Living: 3
Gestational Age

 LMP:           34w 1d        Date:  08/12/20                 EDD:   05/19/21
 U/S Today:     33w 6d                                        EDD:   05/21/21
 Best:          34w 1d     Det. By:  LMP  (08/12/20)          EDD:   05/19/21
Anatomy

 Cranium:               Appears normal         LVOT:                   Previously seen
 Cavum:                 Appears normal         Aortic Arch:            Previously seen
 Ventricles:            Appears normal         Ductal Arch:            Previously seen
 Choroid Plexus:        Previously seen        Diaphragm:              Appears normal
 Cerebellum:            Previously seen        Stomach:                Appears normal, left
                                                                       sided
 Posterior Fossa:       Previously seen        Abdomen:                Previously seen
 Nuchal Fold:           Previously seen        Abdominal Wall:         Previously seen
 Face:                  Orbits and profile     Cord Vessels:           Previously seen
                        previously seen
 Lips:                  Previously seen        Kidneys:                Appear normal
 Palate:                Previously seen        Bladder:                Appears normal
 Thoracic:              Previously seen        Spine:                  Previously seen
 Heart:                 Previously seen        Upper Extremities:      Previously seen
 RVOT:                  Previously seen        Lower Extremities:      Previously seen

 Other:  Heels/feet and open hands/5th digits previously visualized. Lenses
         and nasal bone previously visualized. VC, 3VV and 3VTV previously
         visualized. Fetus appears to be a male.
Cervix Uterus Adnexa

 Cervix
 Not visualized (advanced GA >29wks)

 Uterus
 No abnormality visualized.

 Right Ovary
 Not visualized.

 Left Ovary
 Not visualized.
 Cul De Sac
 No free fluid seen.

 Adnexa
 No adnexal mass visualized.
Impression

 Follow up growth due to abnormal sex chromosome.
 Normal interval growth with measurements consistent with
 dates
 Good fetal movement and amniotic fluid volume
Recommendations

 Follow up as clinically idnicated.

## 2021-12-25 IMAGING — DX DG OR LOCAL ABDOMEN
1 series · 2 of 2 positions shown · non-contrast
Comparison: None.

CLINICAL DATA: Status post C-section.

EXAM:
OR LOCAL ABDOMEN

[Series 1: abdomen · 0.14mm/px · 2 of 2 slices shown]
[im 1/2]
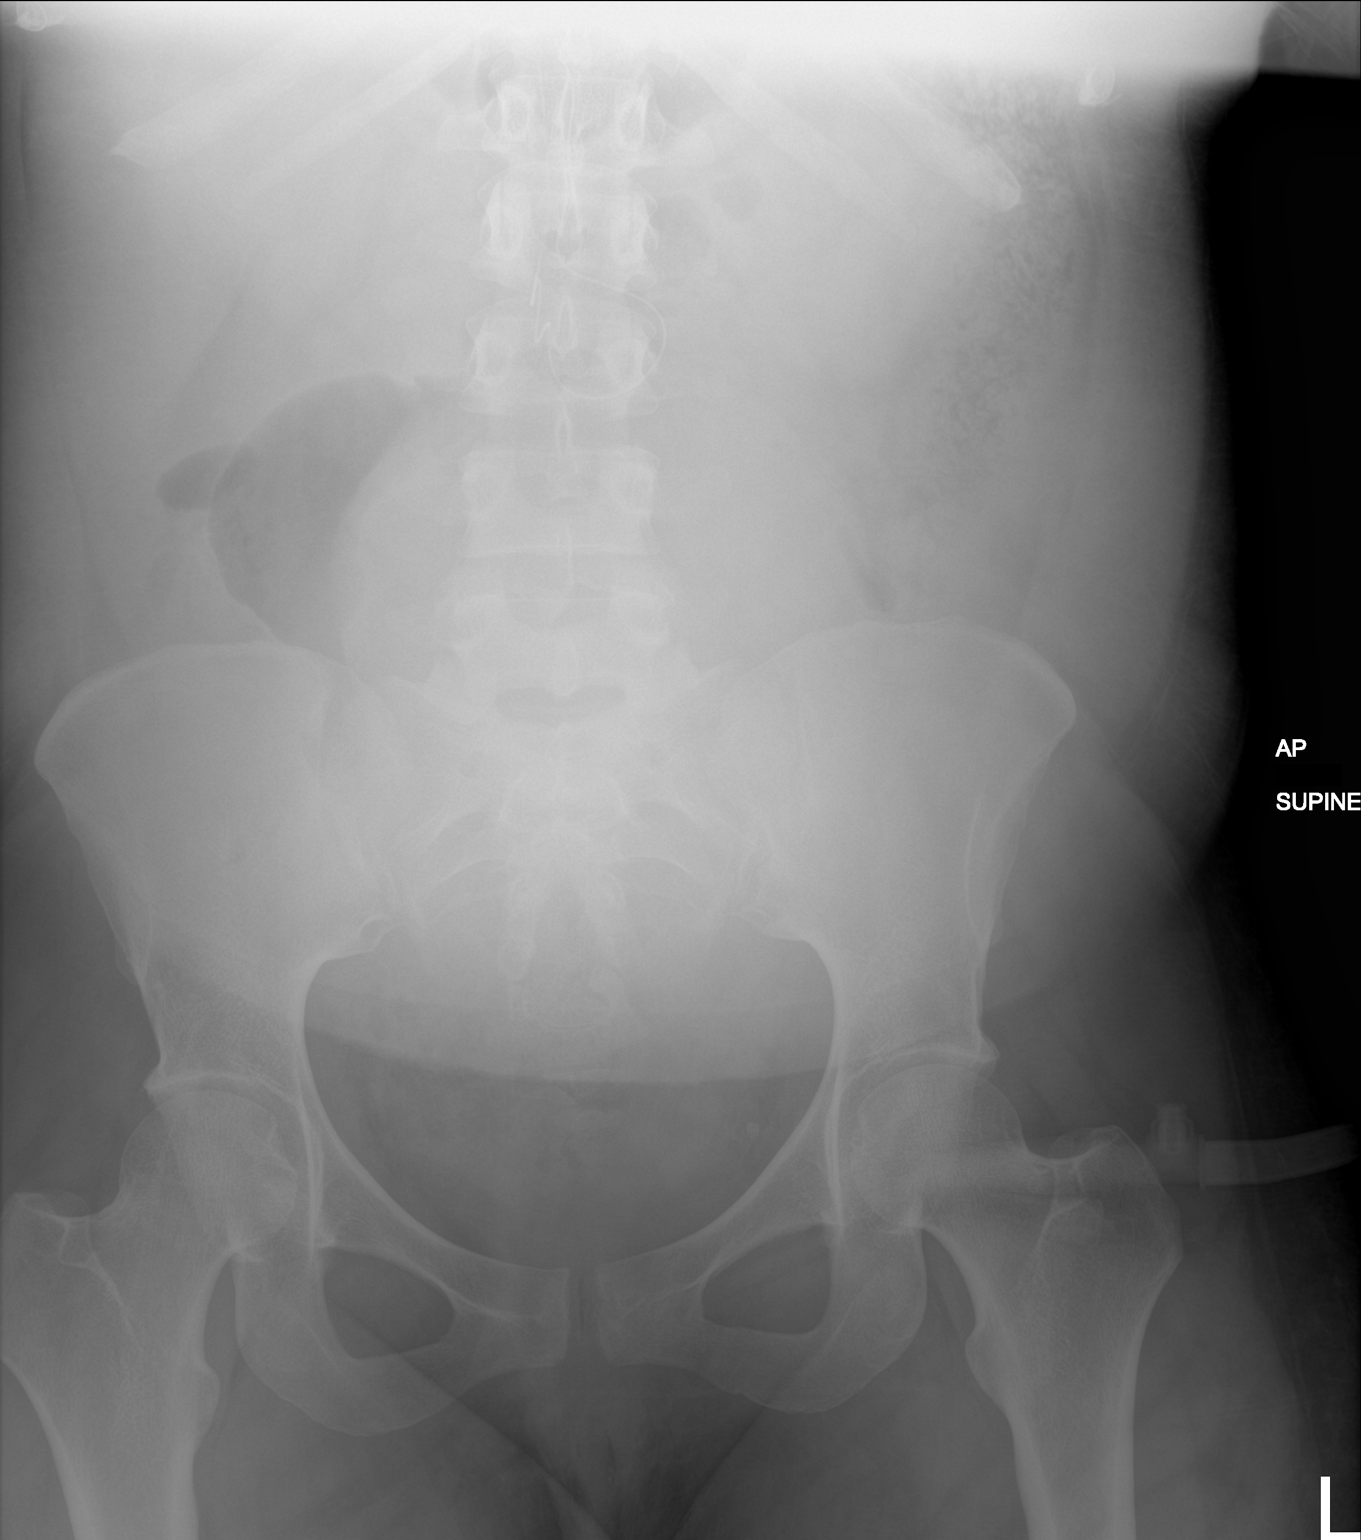
[im 2/2]
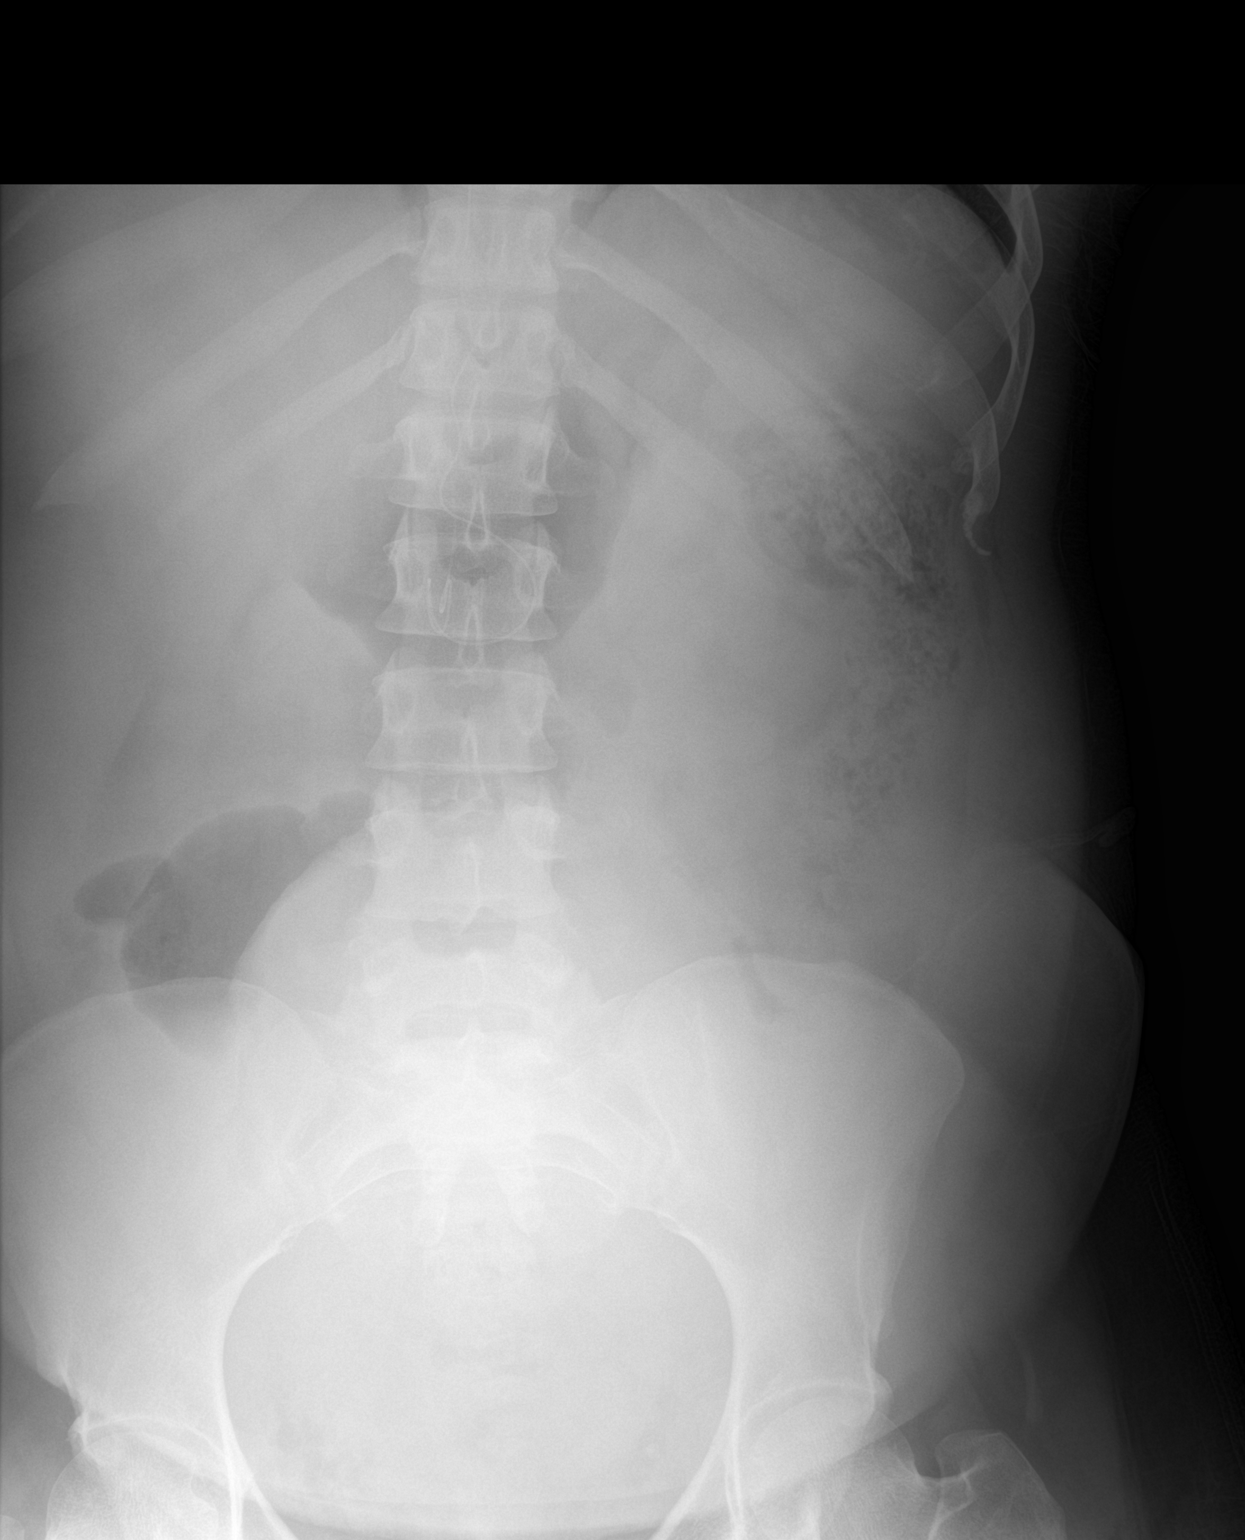

[2 of 2 positions shown; findings below may reference images not displayed]

FINDINGS: No abnormal bowel dilatation is noted. There appears to be a
curvilinear object which is projected over the lower thoracic and
upper lumbar spine and stomach which may represent an external lead
or possibly some form of enteric tube or shunt. No other radiopaque
foreign body is noted.
IMPRESSION: As noted above, curvilinear object seen projected over the lower
thoracic and upper lumbar spine and stomach which may represent
external lead or possibly some form of enteric tube or shunt. No
other radiopaque foreign body is noted. These results were called by
telephone at the time of interpretation on 05/26/2021 at [DATE] to
provider SHAROL PEDEN , who verbally acknowledged these results.

## 2022-03-09 ENCOUNTER — Encounter (HOSPITAL_COMMUNITY): Payer: Self-pay | Admitting: Emergency Medicine

## 2022-03-09 ENCOUNTER — Ambulatory Visit (HOSPITAL_COMMUNITY)
Admission: EM | Admit: 2022-03-09 | Discharge: 2022-03-09 | Disposition: A | Discharge status: Home / Self Care | Payer: Medicaid Other | Attending: Internal Medicine | Admitting: Internal Medicine

## 2022-03-09 DIAGNOSIS — H1032 Unspecified acute conjunctivitis, left eye: Secondary | ICD-10-CM | POA: Diagnosis not present

## 2022-03-09 MED ORDER — ERYTHROMYCIN 5 MG/GM OP OINT: TOPICAL_OINTMENT | Freq: Every day | OPHTHALMIC | 0 refills | Status: AC

## 2022-03-09 NOTE — Discharge Instructions (Signed)
Clean eyes with a clean wet rag Erythromycin eye ointment If you have blurry vision, worsening sensitivity to light or worsening purulent drainage-please return to urgent care immediately to be reevaluated.

## 2022-03-09 NOTE — ED Triage Notes (Signed)
Pt is present today with left eye irritation. Pt sx started yesterday

## 2022-03-10 NOTE — ED Provider Notes (Signed)
[name]    CSN: [id number] Arrival date & time: 03/09/22  1135      History   Chief Complaint Chief Complaint  Patient presents with   Eye Drainage    HPI Tracy Waller is a 31 y.o. female comes to the urgent care with 1 day history of left eye irritation and swelling.  Patient says symptoms started yesterday and has been persistent.  Patient's child has pinkeye.  Patient denies any fever or chills.  No sore throat.  No blurry vision.  No light sensitivity.  No trauma to the eye.  No fever or chills.  No runny nose or sneezing.  No double vision.  Patient endorses purulent eye discharge.  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 05/26/2021   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/30/2020   Alpha thalassemia trait 12/30/2020   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   Encounter for supervision of normal pregnancy in multigravida in first trimester 10/20/2020   Trichomonas infection    Problems related to high-risk sexual behavior    History of syphilis 02/13/2017   Anemia 05/27/2013   H/O cesarean section 05/27/2013   History of pregnancy induced hypertension 05/27/2013   GERD without esophagitis 02/20/2013    Past Surgical History:  Procedure Laterality Date   CESAREAN SECTION     CESAREAN SECTION  05/26/2021   Procedure: CESAREAN SECTION;  Surgeon:  Caren Macadam, MD;  Location: [facility];  Service: Obstetrics;;    OB History     Gravida  5   Para  4   Term  4   Preterm      AB  1   Living  4      SAB  1   IAB      Ectopic      Multiple  0   Live Births  4            Home Medications    Prior to Admission medications   Medication Sig Start Date End Date Taking? Authorizing Provider  erythromycin ophthalmic ointment Place into both eyes at bedtime for 5 days. Place a 1/2 inch ribbon of ointment into the lower eyelid. 03/09/22 03/14/22 Yes Tyion Boylen, Myrene Galas, MD  Blood Pressure Monitoring (BLOOD PRESSURE KIT) DEVI 1 kit by Does not apply route once a week. 10/28/20   Megan Salon, MD  ibuprofen (ADVIL) 600 MG tablet Take 1 tablet (600 mg total) by mouth every 6 (six) hours. 05/28/21   Chancy Milroy, MD  NIFEdipine (ADALAT CC) 30 MG 24 hr tablet Take 1 tablet (30 mg total) by mouth daily. 06/12/21 07/12/21  Janyth Pupa, DO    Family History Family History  Problem Relation Age of Onset   Asthma Father    Hypertension Father    Hypertension Mother    Hypertension Sister  Social History Social History   Tobacco Use   Smoking status: Former    Types: Cigarettes   Smokeless tobacco: Never   Tobacco comments:    last used about 2 weeks ago  Vaping Use   Vaping Use: Never used  Substance Use Topics   Alcohol use: Not Currently    Comment: occasionally   Drug use: Yes    Types: Marijuana    Comment: daily     Allergies   Patient has no known allergies.   Review of Systems Review of Systems As per HPI  Physical Exam Triage Vital Signs ED Triage Vitals  Enc Vitals Group     BP 03/09/22 1325 129/79     Pulse Rate 03/09/22 1325 87     Resp 03/09/22 1325 17     Temp 03/09/22 1325 98.3 F (36.8 C)     Temp src --      SpO2 03/09/22 1325 99 %     Weight --      Height --      Head Circumference --      Peak Flow --      Pain Score 03/09/22 1326 6     Pain Loc --       Pain Edu? --      Excl. in [place]? --    No data found.  Updated Vital Signs BP 129/79   Pulse 87   Temp 98.3 F (36.8 C)   Resp 17   SpO2 99%   Breastfeeding No   Visual Acuity Right Eye Distance:   Left Eye Distance:   Bilateral Distance:    Right Eye Near:   Left Eye Near:    Bilateral Near:     Physical Exam Vitals and nursing note reviewed.  Constitutional:      General: She is not in acute distress.    Appearance: She is not ill-appearing.  HENT:     Right Ear: Tympanic membrane normal.     Left Ear: Tympanic membrane normal.     Mouth/Throat:     Mouth: Mucous membranes are moist.     Pharynx: No oropharyngeal exudate or posterior oropharyngeal erythema.  Eyes:     Pupils: Pupils are equal, round, and reactive to light.     Comments: Left upper eyelid swelling with no erythema.  Extraocular muscles intact.  Pupils are reactive to light.  Conjunctival erythema in the left eye.  Cardiovascular:     Rate and Rhythm: Normal rate and regular rhythm.  Neurological:     Mental Status: She is alert.      UC Treatments / Results  Labs (all labs ordered are listed, but only abnormal results are displayed) Labs Reviewed - No data to display  EKG   Radiology No results found.  Procedures Procedures (including critical care time)  Medications Ordered in UC Medications - No data to display  Initial Impression / Assessment and Plan / UC Course  I have reviewed the triage vital signs and the nursing notes.  Pertinent labs & imaging results that were available during my care of the patient were reviewed by me and considered in my medical decision making (see chart for details).     1.  Acute viral conjunctivitis involving the left eye: I am concerned about superimposed bacterial infection Erythromycin eye ointment Return precautions given Strict hand hygiene practices recommended. Final Clinical Impressions(s) / UC Diagnoses   Final diagnoses:  Acute  bacterial conjunctivitis of left eye  Discharge Instructions      Clean eyes with a clean wet rag Erythromycin eye ointment If you have blurry vision, worsening sensitivity to light or worsening purulent drainage-please return to urgent care immediately to be reevaluated.   ED Prescriptions     Medication Sig Dispense Auth. Provider   erythromycin ophthalmic ointment Place into both eyes at bedtime for 5 days. Place a 1/2 inch ribbon of ointment into the lower eyelid. 3.5 g Olean Sangster, Myrene Galas, MD      PDMP not reviewed this encounter.   Chase Picket, MD 03/10/22 [phone]

## 2023-02-20 ENCOUNTER — Other Ambulatory Visit (HOSPITAL_COMMUNITY)
Admission: RE | Admit: 2023-02-20 | Discharge: 2023-02-20 | Disposition: A | Discharge status: Home / Self Care | Payer: Medicaid Other | Source: Ambulatory Visit | Attending: Family Medicine | Admitting: Family Medicine

## 2023-02-20 ENCOUNTER — Ambulatory Visit (INDEPENDENT_AMBULATORY_CARE_PROVIDER_SITE_OTHER): Payer: Medicaid Other | Admitting: Family Medicine

## 2023-02-20 ENCOUNTER — Encounter: Payer: Self-pay | Admitting: Family Medicine

## 2023-02-20 VITALS — BP 118/74 | HR 60 | Temp 98.2°F | Ht 63.0 in | Wt 174.2 lb

## 2023-02-20 DIAGNOSIS — Z0000 Encounter for general adult medical examination without abnormal findings: Secondary | ICD-10-CM | POA: Diagnosis not present

## 2023-02-20 DIAGNOSIS — D171 Benign lipomatous neoplasm of skin and subcutaneous tissue of trunk: Secondary | ICD-10-CM

## 2023-02-20 DIAGNOSIS — Z8619 Personal history of other infectious and parasitic diseases: Secondary | ICD-10-CM

## 2023-02-20 DIAGNOSIS — Z113 Encounter for screening for infections with a predominantly sexual mode of transmission: Secondary | ICD-10-CM | POA: Diagnosis not present

## 2023-02-20 DIAGNOSIS — Z1159 Encounter for screening for other viral diseases: Secondary | ICD-10-CM | POA: Diagnosis not present

## 2023-02-20 DIAGNOSIS — Z114 Encounter for screening for human immunodeficiency virus [HIV]: Secondary | ICD-10-CM | POA: Diagnosis not present

## 2023-02-20 DIAGNOSIS — A5901 Trichomonal vulvovaginitis: Secondary | ICD-10-CM

## 2023-02-20 DIAGNOSIS — N912 Amenorrhea, unspecified: Secondary | ICD-10-CM | POA: Diagnosis not present

## 2023-02-20 LAB — URINALYSIS, ROUTINE W REFLEX MICROSCOPIC
Bilirubin Urine: NEGATIVE
Hgb urine dipstick: NEGATIVE
Ketones, ur: NEGATIVE
Nitrite: NEGATIVE
RBC / HPF: NONE SEEN (ref 0–?)
Specific Gravity, Urine: 1.015 (ref 1.000–1.030)
Total Protein, Urine: NEGATIVE
Urine Glucose: NEGATIVE
Urobilinogen, UA: 0.2 (ref 0.0–1.0)
pH: 6 (ref 5.0–8.0)

## 2023-02-20 LAB — LIPID PANEL
Cholesterol: 142 mg/dL (ref 0–200)
HDL: 39.9 mg/dL (ref >39.00)
LDL Cholesterol: 85 mg/dL (ref 0–99)
NonHDL: 102.38
Total CHOL/HDL Ratio: 4
Triglycerides: 89 mg/dL (ref 0.0–149.0)
VLDL: 17.8 mg/dL (ref 0.0–40.0)

## 2023-02-20 LAB — CBC
HCT: 39.2 % (ref 36.0–46.0)
Hemoglobin: 12.2 g/dL (ref 12.0–15.0)
MCHC: 31.2 g/dL (ref 30.0–36.0)
MCV: 82.6 fl (ref 78.0–100.0)
Platelets: 253 10*3/uL (ref 150.0–400.0)
RBC: 4.74 Mil/uL (ref 3.87–5.11)
RDW: 14.9 % (ref 11.5–15.5)
WBC: 6.8 10*3/uL (ref 4.0–10.5)

## 2023-02-20 MED ORDER — VALACYCLOVIR HCL 1 G PO TABS: 500.0000 mg | ORAL_TABLET | Freq: Two times a day (BID) | ORAL | 5 refills | Status: DC

## 2023-02-20 NOTE — Patient Instructions (Signed)
Follow-up medication for blood pressure  Has not seen Ortho  Multiple send pulmonology follow-up is a 90 loco

## 2023-02-20 NOTE — Progress Notes (Addendum)
Established Patient Office Visit   Subjective:  Patient ID: Tracy Waller, female    DOB: [birth year]  Age: 32 y.o. MRN: [id number]  Chief Complaint  Patient presents with   Establish Care    NP/establish care check hard knot on left side of abdomin. Patient fasting. Would like STD testing and pregnancy test.     HPI Encounter Diagnoses  Name Primary?   Healthcare maintenance Yes   Lipoma of torso    Screen for STD (sexually transmitted disease)    History of herpes genitalis    Screening for HIV (human immunodeficiency virus)    Encounter for hepatitis C screening test for low risk patient    Amenorrhea    For establishment of care.  She is healthy as far she knows.  She is planning on accessing dental care.  She does exercise regularly by doing calisthenics.  She lives at home with her family.  She is seeing GYN for well woman care.  She is using condoms for contraception and prevention of STDs.  She request screening.  History of genital herpes.  She uses Valtrex episodically.  History of neurosyphilis treated with penicillin in 2017.   Review of Systems  Constitutional: Negative.  Negative for chills, diaphoresis, malaise/fatigue and weight loss.  HENT: Negative.    Eyes: Negative.  Negative for blurred vision, double vision, discharge and redness.  Respiratory: Negative.    Cardiovascular: Negative.  Negative for chest pain.  Gastrointestinal:  Negative for abdominal pain.  Genitourinary: Negative.  Negative for dysuria, frequency and urgency.  Musculoskeletal: Negative.  Negative for falls and myalgias.  Skin:  Negative for rash.  Neurological:  Negative for tingling, speech change, loss of consciousness and weakness.  Endo/Heme/Allergies:  Negative for polydipsia.  Psychiatric/Behavioral: Negative.       Current Outpatient Medications:    valACYclovir (VALTREX) 1000 MG tablet, Take 0.5 tablets (500 mg total) by mouth 2 (two) times daily. As needed for outbreak.,  Disp: 9 tablet, Rfl: 5   Objective:     BP 118/74 (BP Location: Right Arm, Patient Position: Sitting, Cuff Size: Normal)   Pulse 60   Temp 98.2 F (36.8 C) (Temporal)   Ht 5\' 3"  (1.6 m)   Wt 174 lb 3.2 oz (79 kg)   LMP 01/08/2023 (Approximate)   SpO2 100%   Breastfeeding No   BMI 30.86 kg/m    Physical Exam Constitutional:      General: She is not in acute distress.    Appearance: Normal appearance. She is not ill-appearing, toxic-appearing or diaphoretic.  HENT:     Head: Normocephalic and atraumatic.     Right Ear: Tympanic membrane, ear canal and external ear normal.     Left Ear: Tympanic membrane, ear canal and external ear normal.     Mouth/Throat:     Mouth: Mucous membranes are moist.     Pharynx: Oropharynx is clear. No oropharyngeal exudate or posterior oropharyngeal erythema.  Eyes:     General: No scleral icterus.       Right eye: No discharge.        Left eye: No discharge.     Extraocular Movements: Extraocular movements intact.     Conjunctiva/sclera: Conjunctivae normal.     Pupils: Pupils are equal, round, and reactive to light.  Cardiovascular:     Rate and Rhythm: Normal rate and regular rhythm.  Pulmonary:     Effort: Pulmonary effort is normal. No respiratory distress.     Breath  sounds: Normal breath sounds.  Abdominal:     General: Bowel sounds are normal.     Tenderness: There is no abdominal tenderness. There is no guarding.     Hernia: No hernia is present.    Musculoskeletal:     Cervical back: No rigidity or tenderness.  Skin:    General: Skin is warm and dry.  Neurological:     Mental Status: She is alert and oriented to person, place, and time.  Psychiatric:        Mood and Affect: Mood normal.        Behavior: Behavior normal.      No results found for any visits on 02/20/23.    The ASCVD Risk score (Arnett DK, et al., 2019) failed to calculate for the following reasons:   The 2019 ASCVD risk score is only valid for ages  93 to 35    Assessment & Plan:   Healthcare maintenance -     CBC -     Lipid panel -     Urinalysis, Routine w reflex microscopic  Lipoma of torso  Screen for STD (sexually transmitted disease) -     RPR -     Urine cytology ancillary only  History of herpes genitalis -     valACYclovir HCl; Take 0.5 tablets (500 mg total) by mouth 2 (two) times daily. As needed for outbreak.  Dispense: 9 tablet; Refill: 5  Screening for HIV (human immunodeficiency virus) -     HIV Antibody (routine testing w rflx)  Encounter for hepatitis C screening test for low risk patient -     Hepatitis C antibody  Amenorrhea -     hCG, serum, qualitative    No follow-ups on file.  Will follow-up with GYN provider for well woman care and contraception.  Continue condoms for contraception and prevention of STDs.  Information was given for safe sex.  Information was given on lipomas.  She will return if her lipoma increases in size or becomes tender.  Information given for health maintenance and disease prevention.  Mliss Sax, MD

## 2023-02-21 LAB — URINE CYTOLOGY ANCILLARY ONLY
Chlamydia: NEGATIVE
Comment: NEGATIVE
Comment: NEGATIVE
Comment: NORMAL
Neisseria Gonorrhea: NEGATIVE
Trichomonas: POSITIVE — AB

## 2023-02-21 LAB — HIV ANTIBODY (ROUTINE TESTING W REFLEX): HIV 1&2 Ab, 4th Generation: NONREACTIVE

## 2023-02-21 LAB — HCG, SERUM, QUALITATIVE: Preg, Serum: NEGATIVE

## 2023-02-21 LAB — HEPATITIS C ANTIBODY: Hepatitis C Ab: NONREACTIVE

## 2023-02-21 LAB — RPR: RPR Ser Ql: NONREACTIVE

## 2023-02-22 MED ORDER — METRONIDAZOLE 500 MG PO TABS: 500.0000 mg | ORAL_TABLET | Freq: Two times a day (BID) | ORAL | 0 refills | Status: AC

## 2023-02-22 NOTE — Addendum Note (Signed)
Addended by: Andrez Grime on: 02/22/2023 09:59 AM   Modules accepted: Orders

## 2023-02-23 ENCOUNTER — Other Ambulatory Visit: Payer: Self-pay

## 2023-02-23 DIAGNOSIS — A5901 Trichomonal vulvovaginitis: Secondary | ICD-10-CM

## 2024-04-08 ENCOUNTER — Encounter: Payer: Self-pay | Admitting: Family Medicine

## 2024-08-24 ENCOUNTER — Emergency Department (HOSPITAL_BASED_OUTPATIENT_CLINIC_OR_DEPARTMENT_OTHER)
Admission: EM | Admit: 2024-08-24 | Discharge: 2024-08-24 | Disposition: A | Discharge status: Home / Self Care | Attending: Emergency Medicine | Admitting: Emergency Medicine

## 2024-08-24 ENCOUNTER — Encounter (HOSPITAL_BASED_OUTPATIENT_CLINIC_OR_DEPARTMENT_OTHER): Payer: Self-pay

## 2024-08-24 ENCOUNTER — Other Ambulatory Visit: Payer: Self-pay

## 2024-08-24 DIAGNOSIS — R300 Dysuria: Secondary | ICD-10-CM | POA: Diagnosis present

## 2024-08-24 DIAGNOSIS — B9689 Other specified bacterial agents as the cause of diseases classified elsewhere: Secondary | ICD-10-CM

## 2024-08-24 DIAGNOSIS — B009 Herpesviral infection, unspecified: Secondary | ICD-10-CM | POA: Insufficient documentation

## 2024-08-24 DIAGNOSIS — A5901 Trichomonal vulvovaginitis: Secondary | ICD-10-CM | POA: Diagnosis not present

## 2024-08-24 DIAGNOSIS — N760 Acute vaginitis: Secondary | ICD-10-CM | POA: Diagnosis not present

## 2024-08-24 LAB — PREGNANCY, URINE: Preg Test, Ur: NEGATIVE

## 2024-08-24 LAB — URINALYSIS, ROUTINE W REFLEX MICROSCOPIC
Bilirubin Urine: NEGATIVE
Glucose, UA: NEGATIVE mg/dL
Hgb urine dipstick: NEGATIVE
Ketones, ur: NEGATIVE mg/dL
Leukocytes,Ua: NEGATIVE
Nitrite: NEGATIVE
Protein, ur: NEGATIVE mg/dL
Specific Gravity, Urine: 1.015 (ref 1.005–1.030)
pH: 6.5 (ref 5.0–8.0)

## 2024-08-24 LAB — HEPATITIS PANEL, ACUTE
HCV Ab: NONREACTIVE
Hep A IgM: NONREACTIVE
Hep B C IgM: NONREACTIVE
Hepatitis B Surface Ag: NONREACTIVE

## 2024-08-24 LAB — WET PREP, GENITAL
Sperm: NONE SEEN
WBC, Wet Prep HPF POC: >=10 — AB (ref <10)
Yeast Wet Prep HPF POC: NONE SEEN

## 2024-08-24 LAB — HIV ANTIBODY (ROUTINE TESTING W REFLEX): HIV Screen 4th Generation wRfx: NONREACTIVE

## 2024-08-24 MED ORDER — METRONIDAZOLE 500 MG PO TABS
500.0000 mg | ORAL_TABLET | Freq: Once | ORAL | Status: AC
  Administered 2024-08-24: 500 mg via ORAL
  Filled 2024-08-24: qty 1

## 2024-08-24 MED ORDER — VALACYCLOVIR HCL 1 G PO TABS: 1000.0000 mg | ORAL_TABLET | Freq: Every day | ORAL | 0 refills | Status: AC

## 2024-08-24 MED ORDER — METRONIDAZOLE 500 MG PO TABS: 500.0000 mg | ORAL_TABLET | Freq: Two times a day (BID) | ORAL | 0 refills | Status: DC

## 2024-08-24 MED ORDER — VALACYCLOVIR HCL 500 MG PO TABS
1000.0000 mg | ORAL_TABLET | Freq: Once | ORAL | Status: AC
  Administered 2024-08-24: 1000 mg via ORAL
  Filled 2024-08-24: qty 2

## 2024-08-24 NOTE — ED Notes (Signed)

## 2024-08-24 NOTE — ED Notes (Signed)
 Did not visualize but pt endorses a rash and painful urination near her genitalia.

## 2024-08-24 NOTE — Discharge Instructions (Signed)
 You tested positive for bacterial vaginosis which is an overgrowth of the normal vaginal bacteria. This is not a sexually transmitted infection.  You also tested positive for trichomonas which is a sexually transmitted infection.  Please contact your sexual partners and have them complete a full course of treatment before resuming any sexual contact to prevent respreading this infection.  On your exam, it also appears you have herpetic lesions on the labia.  Your urine did not show any signs of infection.  Your pregnancy test was negative.  Your testing for gonorrhea, chlamydia, syphilis, HIV, and hepatitis is still pending.  If any of these are positive, you will be contacted to undergo further treatment  Medications:  You have been prescribed an antibiotic called metronidazole  to treat your bacterial vaginosis and trichomonas infections.  Please take this medication twice daily for the next 7 days.  You were given your first dose here today.  Take your next dose this evening.  Take the full course of the antibiotic even if you start feeling better. Avoid alcohol when taking this medication as it can cause severe vomiting.  You have been prescribed a medication called valacyclovir  to treat your herpes infection.  Please take this once daily for 5 days.  You were given your first dose here today.  Take your next dose tomorrow.  Return instructions:  Please return to the Emergency Department if you experience worsening symptoms.  Please return if you have any other emergent concerns.

## 2024-08-24 NOTE — ED Triage Notes (Signed)
 Reports HSV2 outbreak 2 days ago. Also reports dysuria.

## 2024-08-24 NOTE — ED Provider Notes (Signed)
 [place] [facility] AT [facility] Provider Note   CSN: [id number] Arrival date & time: 08/24/24  9066     Patient presents with: Rash and Dysuria   Tracy Waller is a 33 y.o. female with history of syphilis, chlamydia, trichomoniasis, herpes simplex, presents with concern for a foul smell to her urine that has been ongoing for the past month.  She also notes bumps that have arisen on her labia 2 days ago.  She reports that her vaginal discharge seems somewhat different, but unable to explain why.  Denies any abdominal pain, nausea or vomiting.  No fever or chills.  No known exposure to STIs.    Rash Dysuria      Prior to Admission medications   Medication Sig Start Date End Date Taking? Authorizing Provider  metroNIDAZOLE  (FLAGYL ) 500 MG tablet Take 1 tablet (500 mg total) by mouth 2 (two) times daily. 08/24/24  Yes Veta Palma, PA-C  valACYclovir  (VALTREX ) 1000 MG tablet Take 1 tablet (1,000 mg total) by mouth daily for 4 days. 08/25/24 08/29/24 Yes Veta Palma, PA-C    Allergies: Pollen extract    Review of Systems  Genitourinary:  Positive for dysuria.  Skin:  Positive for rash.    Updated Vital Signs BP 120/77 (BP Location: Right Arm)   Pulse 79   Temp 98.4 F (36.9 C) (Oral)   Resp 15   LMP 08/17/2024 (Approximate)   SpO2 100%   Physical Exam Vitals and nursing note reviewed. Exam conducted with a chaperone present.  Constitutional:      General: She is not in acute distress.    Appearance: She is well-developed.  HENT:     Head: Normocephalic and atraumatic.  Eyes:     Conjunctiva/sclera: Conjunctivae normal.  Cardiovascular:     Rate and Rhythm: Normal rate and regular rhythm.     Heart sounds: No murmur heard. Pulmonary:     Effort: Pulmonary effort is normal. No respiratory distress.     Breath sounds: Normal breath sounds.  Abdominal:     Palpations: Abdomen is soft.     Tenderness: There is abdominal  tenderness.     Comments: Mild suprapubic tenderness to palpation No CVA tenderness bilaterally  Genitourinary:    Comments: Pelvic exam performed with NT Tracy Waller present Patient with grouped vesicles on the left labia.  Moderate amount of creamy white discharge from the vaginal vault.  No cervical motion tenderness Musculoskeletal:        General: No swelling.     Cervical back: Neck supple.  Skin:    General: Skin is warm and dry.     Capillary Refill: Capillary refill takes less than 2 seconds.  Neurological:     Mental Status: She is alert.  Psychiatric:        Mood and Affect: Mood normal.     (all labs ordered are listed, but only abnormal results are displayed) Labs Reviewed  WET PREP, GENITAL - Abnormal; Notable for the following components:      Result Value   Trich, Wet Prep PRESENT (*)    Clue Cells Wet Prep HPF POC PRESENT (*)    WBC, Wet Prep HPF POC >=10 (*)    All other components within normal limits  URINALYSIS, ROUTINE W REFLEX MICROSCOPIC  PREGNANCY, URINE  HIV ANTIBODY (ROUTINE TESTING W REFLEX)  SYPHILIS: RPR W/REFLEX TO RPR TITER AND TREPONEMAL ANTIBODIES, TRADITIONAL SCREENING AND DIAGNOSIS ALGORITHM  HEPATITIS PANEL, ACUTE  GC/CHLAMYDIA PROBE AMP (  [facility]) NOT AT [facility]    EKG: None  Radiology: No results found.   Procedures   Medications Ordered in the ED  valACYclovir  (VALTREX ) tablet 1,000 mg (1,000 mg Oral Given 08/24/24 1244)  metroNIDAZOLE  (FLAGYL ) tablet 500 mg (500 mg Oral Given 08/24/24 1244)                                    Medical Decision Making Amount and/or Complexity of Data Reviewed Labs: ordered.  Risk Prescription drug management.     Differential diagnosis includes but is not limited to herpes simplex, STI, UTI, pyelonephritis, PID  ED Course:  Upon initial evaluation, patient is very well-appearing, no acute distress.  Normal vital signs.  Mild lower abdominal tenderness to palpation without  rebound or guarding.  Pelvic exam was performed with NT chaperone present.  Patient had grouped vesicles on the left labia consistent with herpes simplex.  No overlying fluctuance to suggest abscess.  Patient also with white creamy discharge in the vaginal vault.  No cervical motion tenderness.  Labs Ordered: I Ordered, and personally interpreted labs.  The pertinent results include:   Urinalysis without evidence of infection Urine pregnancy negative Wet prep positive for trichomonas and BV.  Negative for yeast Gonorrhea and Chlamydia swab still pending RPR, HIV, hepatitis testing pending  Medications Given: Valacyclovir  Metronidazole   Upon re-evaluation, patient patient remains well-appearing. Urine without signs of UTI. Pregnancy negative. Her wet prep was positive for trichomonas and bacterial vaginosis.  Will treat with course of metronidazole , first dose given here today.  Given her herpetic lesions on the labia, will also treat with course of valacyclovir .  She reports that this is not her first herpes outbreak, will treat with 1 g for 5 days given this is a recurrent episode.  Her other STI testing including gonorrhea, chlamydia, syphilis, HIV, and hepatitis is still pending.  She does not have any known exposure to these, no indication for prophylactic treatment at this time.  Does not have any significant abdominal pain, no fevers, no cervical motion tenderness, no concern for PID at this time.  Patient is stable and appropriate for discharge home    Impression: Bacterial vaginosis Trichomonas Herpes simplex  Disposition:  The patient was discharged home with instructions to take course of metronidazole  and valacyclovir  as prescribed.  She understands that trichomonas is a sexually transmitted infection and that both her and her sexual partners need to complete a full course of treatment before resuming sexual activity.  She understands that she will be contacted if any other testing  is positive and she requires further treatment. Return precautions given and patient verbalized understanding.    This chart was dictated using voice recognition software, Dragon. Despite the best efforts of this provider to proofread and correct errors, errors may still occur which can change documentation meaning.       Final diagnoses:  Bacterial vaginosis  Trichomonas vaginalis (TV) infection  Herpes simplex    ED Discharge Orders          Ordered    valACYclovir  (VALTREX ) 1000 MG tablet  Daily        08/24/24 1259    metroNIDAZOLE  (FLAGYL ) 500 MG tablet  2 times daily        08/24/24 1259               Veta Palma, PA-C 08/24/24 1306    Gray, Alicia  P, DO 08/30/24 2344

## 2024-08-25 LAB — GC/CHLAMYDIA PROBE AMP (~~LOC~~) NOT AT ARMC
Chlamydia: POSITIVE — AB
Comment: NEGATIVE
Comment: NORMAL
Neisseria Gonorrhea: NEGATIVE

## 2024-08-25 LAB — SYPHILIS: RPR W/REFLEX TO RPR TITER AND TREPONEMAL ANTIBODIES, TRADITIONAL SCREENING AND DIAGNOSIS ALGORITHM: RPR Ser Ql: NONREACTIVE

## 2024-08-27 ENCOUNTER — Ambulatory Visit (HOSPITAL_COMMUNITY): Payer: Self-pay

## 2024-08-27 ENCOUNTER — Telehealth (HOSPITAL_COMMUNITY): Payer: Self-pay

## 2024-08-27 MED ORDER — DOXYCYCLINE HYCLATE 100 MG PO CAPS: 100.0000 mg | ORAL_CAPSULE | Freq: Two times a day (BID) | ORAL | 0 refills | Status: AC

## 2024-08-27 NOTE — Telephone Encounter (Signed)
 This patient tested positive for Chlamydia   She is allergic to Pollen I have informed the patient of her results and confirmed her pharmacy is correct in her chart. Treatment has been sent per protocol.   Arlander Katheryn RAMAN, RN

## 2024-10-06 ENCOUNTER — Encounter: Payer: Self-pay | Admitting: Family Medicine

## 2024-10-06 ENCOUNTER — Ambulatory Visit (INDEPENDENT_AMBULATORY_CARE_PROVIDER_SITE_OTHER): Admitting: Family Medicine

## 2024-10-06 VITALS — BP 105/73 | HR 88 | Temp 98.3°F | Resp 16 | Ht 63.0 in | Wt 180.0 lb

## 2024-10-06 DIAGNOSIS — Z131 Encounter for screening for diabetes mellitus: Secondary | ICD-10-CM

## 2024-10-06 DIAGNOSIS — Z13220 Encounter for screening for lipoid disorders: Secondary | ICD-10-CM

## 2024-10-06 DIAGNOSIS — Z0000 Encounter for general adult medical examination without abnormal findings: Secondary | ICD-10-CM

## 2024-10-06 DIAGNOSIS — Z8619 Personal history of other infectious and parasitic diseases: Secondary | ICD-10-CM

## 2024-10-06 LAB — URINALYSIS, ROUTINE W REFLEX MICROSCOPIC
Bilirubin Urine: NEGATIVE
Hgb urine dipstick: NEGATIVE
Ketones, ur: NEGATIVE
Leukocytes,Ua: NEGATIVE
Nitrite: NEGATIVE
RBC / HPF: NONE SEEN
Specific Gravity, Urine: 1.01 (ref 1.000–1.030)
Total Protein, Urine: NEGATIVE
Urine Glucose: NEGATIVE
Urobilinogen, UA: 0.2 (ref 0.0–1.0)
pH: 6.5 (ref 5.0–8.0)

## 2024-10-06 LAB — COMPREHENSIVE METABOLIC PANEL WITH GFR
ALT: 22 U/L (ref 3–35)
AST: 20 U/L (ref 5–37)
Albumin: 4.4 g/dL (ref 3.5–5.2)
Alkaline Phosphatase: 72 U/L (ref 39–117)
BUN: 12 mg/dL (ref 6–23)
CO2: 29 meq/L (ref 19–32)
Calcium: 9.5 mg/dL (ref 8.4–10.5)
Chloride: 104 meq/L (ref 96–112)
Creatinine, Ser: 0.79 mg/dL (ref 0.40–1.20)
GFR: 98.58 mL/min
Glucose, Bld: 87 mg/dL (ref 70–99)
Potassium: 3.7 meq/L (ref 3.5–5.1)
Sodium: 138 meq/L (ref 135–145)
Total Bilirubin: 0.4 mg/dL (ref 0.2–1.2)
Total Protein: 8.2 g/dL (ref 6.0–8.3)

## 2024-10-06 LAB — CBC
HCT: 36.2 % (ref 36.0–46.0)
Hemoglobin: 11.7 g/dL — ABNORMAL LOW (ref 12.0–15.0)
MCHC: 32.4 g/dL (ref 30.0–36.0)
MCV: 79.8 fl (ref 78.0–100.0)
Platelets: 242 K/uL (ref 150.0–400.0)
RBC: 4.54 Mil/uL (ref 3.87–5.11)
RDW: 15.7 % — ABNORMAL HIGH (ref 11.5–15.5)
WBC: 7.5 K/uL (ref 4.0–10.5)

## 2024-10-06 LAB — LDL CHOLESTEROL, DIRECT: Direct LDL: 97 mg/dL

## 2024-10-06 LAB — HEMOGLOBIN A1C: Hgb A1c MFr Bld: 5.4 % (ref 4.6–6.5)

## 2024-10-06 NOTE — Progress Notes (Signed)
 "  Established Patient Office Visit   Subjective:  Patient ID: Tracy Waller, female    DOB: 11-Jun-1991  Age: 34 y.o. MRN: [id number]  Chief Complaint  Patient presents with   Annual Exam    HPI Encounter Diagnoses  Name Primary?   Healthcare maintenance Yes   History of herpes genitalis    Screening for cholesterol level    Screening for diabetes mellitus    History of chlamydia infection    Here for physical.  Doing well.  Recently started working again.  Does have regular dental care.  She is not exercising regularly at this time.  Was diagnosed and treated for chlamydia status post ER visit in November of last year.  She is having no symptoms.  Denies dysuria discharge.   Review of Systems  Constitutional: Negative.   HENT: Negative.    Eyes:  Negative for blurred vision, discharge and redness.  Respiratory: Negative.    Cardiovascular: Negative.   Gastrointestinal:  Negative for abdominal pain.  Genitourinary: Negative.  Negative for dysuria, frequency, hematuria and urgency.  Musculoskeletal: Negative.  Negative for myalgias.  Skin:  Negative for rash.  Neurological:  Negative for tingling, loss of consciousness and weakness.  Endo/Heme/Allergies:  Negative for polydipsia.    Current Medications[1]   Objective:     BP 105/73 (BP Location: Right Arm, Patient Position: Sitting, Cuff Size: Normal)   Pulse 88   Temp 98.3 F (36.8 C) (Oral)   Resp 16   Ht 5' 3 (1.6 m)   Wt 180 lb (81.6 kg)   SpO2 99%   BMI 31.89 kg/m  BP Readings from Last 3 Encounters:  10/06/24 105/73  08/24/24 120/77  02/20/23 118/74   Wt Readings from Last 3 Encounters:  10/06/24 180 lb (81.6 kg)  02/20/23 174 lb 3.2 oz (79 kg)  06/28/21 158 lb 6.4 oz (71.8 kg)      Physical Exam Constitutional:      General: She is not in acute distress.    Appearance: Normal appearance. She is not ill-appearing, toxic-appearing or diaphoretic.  HENT:     Head: Normocephalic and atraumatic.      Right Ear: Tympanic membrane, ear canal and external ear normal.     Left Ear: Tympanic membrane, ear canal and external ear normal.     Mouth/Throat:     Mouth: Mucous membranes are moist.     Pharynx: Oropharynx is clear. No oropharyngeal exudate or posterior oropharyngeal erythema.  Eyes:     General: No scleral icterus.       Right eye: No discharge.        Left eye: No discharge.     Extraocular Movements: Extraocular movements intact.     Conjunctiva/sclera: Conjunctivae normal.     Pupils: Pupils are equal, round, and reactive to light.  Cardiovascular:     Rate and Rhythm: Normal rate and regular rhythm.  Pulmonary:     Effort: Pulmonary effort is normal. No respiratory distress.     Breath sounds: Normal breath sounds.  Abdominal:     General: Bowel sounds are normal.     Tenderness: There is no right CVA tenderness or left CVA tenderness.  Musculoskeletal:     Cervical back: No rigidity or tenderness.  Lymphadenopathy:     Cervical: No cervical adenopathy.  Skin:    General: Skin is warm and dry.  Neurological:     Mental Status: She is alert and oriented to person, place, and time.  Psychiatric:        Mood and Affect: Mood normal.        Behavior: Behavior normal.      No results found for any visits on 10/06/24.    The ASCVD Risk score (Arnett DK, et al., 2019) failed to calculate for the following reasons:   The 2019 ASCVD risk score is only valid for ages 26 to 61    Assessment & Plan:   Healthcare maintenance -     CBC -     Urinalysis, Routine w reflex microscopic  History of herpes genitalis  Screening for cholesterol level -     Comprehensive metabolic panel with GFR -     LDL cholesterol, direct  Screening for diabetes mellitus -     Comprehensive metabolic panel with GFR -     Hemoglobin A1c  History of chlamydia infection -     Cervicovaginal ancillary only    No follow-ups on file.  Follow-up today on recent treatment for  chlamydia.  Information was given safe sex.  Information given about health maintenance and prevention of disease.  Checking routine labs.  Recommended follow-up with GYN provider in care.  Recommended regular exercise such as walking for 30 minutes daily.  Information was given on exercising.  Elsie Sim Lent, MD    [1] No current outpatient medications on file.  "

## 2024-10-07 ENCOUNTER — Ambulatory Visit: Payer: Self-pay | Admitting: Family Medicine

## 2024-11-13 ENCOUNTER — Encounter: Admitting: Family Medicine
